# Patient Record
Sex: Male | Born: 1960 | Race: White | Hispanic: No | Marital: Married | Smoking: Never smoker
Health system: Southern US, Community
[De-identification: ages and names within clinical notes are randomized; demographics above are authoritative.]

## PROBLEM LIST (undated history)

## (undated) DIAGNOSIS — T7840XA Allergy, unspecified, initial encounter: Secondary | ICD-10-CM

## (undated) DIAGNOSIS — I10 Essential (primary) hypertension: Secondary | ICD-10-CM

## (undated) DIAGNOSIS — E119 Type 2 diabetes mellitus without complications: Secondary | ICD-10-CM

## (undated) DIAGNOSIS — M1990 Unspecified osteoarthritis, unspecified site: Secondary | ICD-10-CM

## (undated) DIAGNOSIS — F419 Anxiety disorder, unspecified: Secondary | ICD-10-CM

## (undated) DIAGNOSIS — G4730 Sleep apnea, unspecified: Secondary | ICD-10-CM

## (undated) DIAGNOSIS — K219 Gastro-esophageal reflux disease without esophagitis: Secondary | ICD-10-CM

## (undated) DIAGNOSIS — F32A Depression, unspecified: Secondary | ICD-10-CM

## (undated) DIAGNOSIS — E785 Hyperlipidemia, unspecified: Secondary | ICD-10-CM

## (undated) DIAGNOSIS — H269 Unspecified cataract: Secondary | ICD-10-CM

## (undated) HISTORY — DX: Unspecified cataract: H26.9

## (undated) HISTORY — DX: Depression, unspecified: F32.A

## (undated) HISTORY — DX: Allergy, unspecified, initial encounter: T78.40XA

## (undated) HISTORY — DX: Unspecified osteoarthritis, unspecified site: M19.90

## (undated) HISTORY — DX: Anxiety disorder, unspecified: F41.9

## (undated) HISTORY — DX: Sleep apnea, unspecified: G47.30

## (undated) HISTORY — DX: Gastro-esophageal reflux disease without esophagitis: K21.9

## (undated) HISTORY — DX: Type 2 diabetes mellitus without complications: E11.9

## (undated) HISTORY — PX: FRACTURE SURGERY: SHX138

## (undated) HISTORY — PX: WISDOM TOOTH EXTRACTION: SHX21

## (undated) HISTORY — DX: Hyperlipidemia, unspecified: E78.5

## (undated) HISTORY — PX: COLONOSCOPY: SHX174

## (undated) HISTORY — DX: Essential (primary) hypertension: I10

---

## 2014-03-19 LAB — HM DIABETES EYE EXAM

## 2014-03-24 ENCOUNTER — Ambulatory Visit (INDEPENDENT_AMBULATORY_CARE_PROVIDER_SITE_OTHER): Payer: BC Managed Care – PPO | Admitting: Family Medicine

## 2014-03-24 VITALS — BP 124/76 | HR 70 | Temp 97.8°F | Resp 16 | Ht 71.0 in | Wt 232.6 lb

## 2014-03-24 DIAGNOSIS — S76302A Unspecified injury of muscle, fascia and tendon of the posterior muscle group at thigh level, left thigh, initial encounter: Secondary | ICD-10-CM

## 2014-03-24 MED ORDER — PREDNISONE 20 MG PO TABS: 40.0000 mg | ORAL_TABLET | Freq: Every day | ORAL | Status: DC

## 2014-03-24 NOTE — Progress Notes (Signed)
Patient ID: Francisco Griffin, male   DOB: 08/28/1960, 53 y.o.   MRN: [id number]  This chart was scribed for Elvina SidleKurt Jaque Dacy, MD by Charline BillsEssence Howell, ED Scribe. The patient was seen in room 11. Patient's care was started at 12:21 PM.  Patient ID: Francisco Griffin MRN: [id number], DOB: 08/28/1960, 53 y.o. Date of Encounter: 03/24/2014, 12:21 PM  Primary Physician: No primary provider on file.  Chief Complaint  Patient presents with  . Knee Pain    left knee pain--x 5 days--better but still sore--not tender to touch--sore at the back of the knee    HPI: 53 y.o. year old male with history below presents with intermittent L knee pain onset 4 days ago. Pain is worse in the back of the knee. He states that he had transitioned from walking to jogging for approximately 100 ft when he felt a twinge followed by a sharp pain in the back of his knee. Pt was able to walk following the incident. He reports that he currently ambulates with a limp due to pain. Pt denies swelling and discoloration. He has tried ice, heat, stretching and ibuprofen for pain.   Pt works as a Interior and spatial designerrehab engineer; he looks at different sites and tries to figure out how to make the sites accessible for the disabled.   Past Medical History  Diagnosis Date  . Allergy   . Anxiety   . Diabetes mellitus without complication      Home Meds: Prior to Admission medications   Medication Sig Start Date End Date Taking? Authorizing Provider  metFORMIN (GLUCOPHAGE) 850 MG tablet Take 850 mg by mouth 2 (two) times daily with a meal.   Yes Historical Provider, MD  rosuvastatin (CRESTOR) 5 MG tablet Take 2.5 mg by mouth daily.   Yes Historical Provider, MD    Allergies:  Allergies  Allergen Reactions  . Lipitor [Atorvastatin]     History   Social History  . Marital Status: Divorced    Spouse Name: N/A    Number of Children: N/A  . Years of Education: N/A   Occupational History  . Not on file.   Social History Main Topics  . Smoking  status: Never Smoker   . Smokeless tobacco: Not on file  . Alcohol Use: 0.5 oz/week    1 drink(s) per week  . Drug Use: Not on file  . Sexual Activity: Not on file   Other Topics Concern  . Not on file   Social History Narrative  . No narrative on file     Review of Systems: Constitutional: negative for chills, fever, night sweats, weight changes, or fatigue  HEENT: negative for vision changes, hearing loss, congestion, rhinorrhea, ST, epistaxis, or sinus pressure Cardiovascular: negative for chest pain or palpitations Respiratory: negative for hemoptysis, wheezing, shortness of breath, or cough Abdominal: negative for abdominal pain, nausea, vomiting, diarrhea, or constipation Msk: negative for joint swelling, +arthalgias Dermatological: negative for rash Neurologic: negative for headache, dizziness, or syncope All other systems reviewed and are otherwise negative with the exception to those above and in the HPI.   Physical Exam: Triage Vitals: Blood pressure 124/76, pulse 70, temperature 97.8 F (36.6 C), temperature source Oral, resp. rate 16, height 5\' 11"  (1.803 m), weight 232 lb 9.6 oz (105.507 kg), SpO2 98.00%., Body mass index is 32.46 kg/(m^2). General: Well developed, well nourished, in no acute distress. Head: Normocephalic, atraumatic, eyes without discharge, sclera non-icteric, nares are without discharge. Bilateral auditory canals clear, TM's are without perforation, pearly grey  and translucent with reflective cone of light bilaterally. Oral cavity moist, posterior pharynx without exudate, erythema, peritonsillar abscess, or post nasal drip.  Neck: Supple. No thyromegaly. Full ROM. No lymphadenopathy. Lungs: Clear bilaterally to auscultation without wheezes, rales, or rhonchi. Breathing is unlabored. Heart: RRR with S1 S2. No murmurs, rubs, or gallops appreciated. Abdomen: Soft, non-tender, non-distended with normoactive bowel sounds. No hepatomegaly. No  rebound/guarding. No obvious abdominal masses. Msk:  Strength and tone normal for age. Very tight semitendinosus which is tender. Knee ligaments and palpation on inspection are otherwise normal.  Extremities/Skin: Warm and dry. No clubbing or cyanosis. No edema. No rashes or suspicious lesions. Neuro: Alert and oriented X 3. Moves all extremities spontaneously. Gait is normal. CNII-XII grossly in tact. Psych:  Responds to questions appropriately with a normal affect.    ASSESSMENT AND PLAN:  53 y.o. year old male with  1. Hamstring injury, left, initial encounter    I personally performed the services described in this documentation, which was scribed in my presence. The recorded information has been reviewed and is accurate.  Signed, Elvina SidleKurt Whitt Auletta, MD 03/24/2014 12:21 PM

## 2014-03-24 NOTE — Patient Instructions (Addendum)
Hamstring Strain  Hamstrings are the large muscles in the back of the thighs. A strain or tear injury happens when there is a sudden stretch or pull on these muscles and tendons. Tendons are cord like structures that attach muscle to bone. These injuries are commonly seen in activities such as sprinting due to sudden acceleration.  DIAGNOSIS  Often the diagnosis can be made by examination. HOME CARE INSTRUCTIONS   Apply ice to the sore area for 15-7520minutes, 03-04 times per day. Do this while awake for the first 2 days. Put the ice in a plastic bag, and place a towel between the bag of ice and your skin.  Keep your knee flexed when possible. This means your foot is held off the ground slightly if you are on crutches. When lying down, a pillow under the knee will take strain off the muscles and provide some relief.  If a compression bandage such as an ace wrap was applied, use it until you are seen again. You may remove it for sleeping, showers and baths. If the wrap seems to be too tight and is uncomfortable, wrap it more loosely. If your toes or foot are getting cold or blue, it is too tight.  Walk or move around as the pain allows, or as instructed. Resume full activities as suggested by your caregiver. This is often safest when the strength of the injured leg has nearly returned to normal.  Only take over-the-counter or prescription medicines for pain, discomfort, or fever as directed by your caregiver. SEEK MEDICAL CARE IF:   You have an increase in bruising, swelling or pain.  You notice coldness or blueness of your toes or foot.  Pain relief is not obtained with medications.  You have increasing pain in the area and seem to be getting worse rather than better.  You notice your thigh getting larger in size (this could indicate bleeding into the muscle). Document Released: 02/07/2001 Document Revised: 08/07/2011 Document Reviewed: 05/17/2008 [facility] Patient Information 2015  [place], [place]. This information is not intended to replace advice given to you by your health care provider. Make sure you discuss any questions you have with your health care provider.   Crestor type medicine makes muscle injury more likely as well

## 2014-07-30 ENCOUNTER — Ambulatory Visit (INDEPENDENT_AMBULATORY_CARE_PROVIDER_SITE_OTHER): Payer: BC Managed Care – PPO | Admitting: Physician Assistant

## 2014-07-30 VITALS — BP 134/82 | HR 79 | Temp 98.2°F | Resp 16 | Ht 71.5 in | Wt 228.0 lb

## 2014-07-30 DIAGNOSIS — E1169 Type 2 diabetes mellitus with other specified complication: Secondary | ICD-10-CM | POA: Insufficient documentation

## 2014-07-30 DIAGNOSIS — E785 Hyperlipidemia, unspecified: Secondary | ICD-10-CM

## 2014-07-30 DIAGNOSIS — J029 Acute pharyngitis, unspecified: Secondary | ICD-10-CM | POA: Diagnosis not present

## 2014-07-30 LAB — POCT RAPID STREP A (OFFICE): RAPID STREP A SCREEN: NEGATIVE

## 2014-07-30 NOTE — Patient Instructions (Signed)
I think the head and nasal congestion you've been dealing with is most likely due to allergies or recurrent viral uris.  For allergies, daily claritin and daily nasal steroid sprays will help throughout allergy season.  A decongestant like sudafed as needed will help with the congestion. Rest and fluids will help. The sore throat is most likely due to all the congestion and breathing through your mouth more. A humidifier in your room at night may help. Lozenges or sore throat spray may help. Please let us know if you're not better in one week. I'll plan to see you in May or June for management.

## 2014-07-30 NOTE — Progress Notes (Signed)
   Subjective:    Patient ID: Francisco Griffin, male    DOB: 14-Mar-1961, 54 y.o.   MRN: [id number]  Chief Complaint  Patient presents with  . Sore Throat    yesterday  . Otalgia  . Nasal Congestion   Patient Active Problem List   Diagnosis Date Noted  . Diabetes mellitus 07/30/2014   Prior to Admission medications   Medication Sig Start Date End Date Taking? Authorizing Provider  ALPRAZolam (XANAX) 0.25 MG tablet Take 0.25 mg by mouth daily as needed. 06/29/14  Yes Historical Provider, MD  lisinopril (PRINIVIL,ZESTRIL) 2.5 MG tablet  06/29/14  Yes Historical Provider, MD  metFORMIN (GLUCOPHAGE) 850 MG tablet Take 850 mg by mouth 2 (two) times daily with a meal.   Yes Historical Provider, MD  rosuvastatin (CRESTOR) 5 MG tablet Take 2.5 mg by mouth daily.   Yes Historical Provider, MD   Medications, allergies, past medical history, surgical history, family history, social history and problem list reviewed and updated.  HPI  [age] yom with pmh dm2 presents with one day h/o sore throat, otalgia.   Has had nasal congestion for past few wks. Has hx seasonal allergies. Does not take anything for it.   Since yest has had mild st, bilateral otalgia. Mild non prod cough. Denies cp, sob, abd pain, n/v, diarrhea. Denies fever, chills. Pt would like strep swab as has had strep in the past.   Review of Systems See HPI.     Objective:   Physical Exam  Constitutional: He appears well-developed and well-nourished.  Non-toxic appearance. He does not have a sickly appearance. He does not appear ill. No distress.  BP 134/82 mmHg  Pulse 79  Temp(Src) 98.2 F (36.8 C) (Oral)  Resp 16  Ht 5' 11.5" (1.816 m)  Wt 228 lb (103.42 kg)  BMI 31.36 kg/m2  SpO2 98%   HENT:  Right Ear: Tympanic membrane is not erythematous. A middle ear effusion is present.  Left Ear: Tympanic membrane is not erythematous. A middle ear effusion is present.  Nose: Nose normal. Right sinus exhibits no maxillary sinus tenderness  and no frontal sinus tenderness. Left sinus exhibits no maxillary sinus tenderness and no frontal sinus tenderness.  Mouth/Throat: Uvula is midline, oropharynx is clear and moist and mucous membranes are normal.  Neck: No Brudzinski's sign noted.  Pulmonary/Chest: Effort normal and breath sounds normal. He has no decreased breath sounds. He has no wheezes. He has no rhonchi. He has no rales.  Lymphadenopathy:       Head (right side): No submental, no submandibular and no tonsillar adenopathy present.       Head (left side): No submental, no submandibular and no tonsillar adenopathy present.    He has no cervical adenopathy.   Results for orders placed or performed in visit on 07/30/14  POCT rapid strep A  Result Value Ref Range   Rapid Strep A Screen Negative Negative      Assessment & Plan:   [age] yom with pmh dm2 presents with one day h/o sore throat, otalgia.   Sore throat - Plan: POCT rapid strep A, Culture, Group A Strep --strep swab neg --exam, vitals normal --suspect viral uri/allergies --> claritin, nasonex, decongestant as needed --sore throat could be due to night time congestion and mouth breathing --> above congestion tx and humidifier in room, tylenol prn, lozenges, rest   Francisco Lopesodd M. Alahni Varone, PA-C Physician Assistant-Certified [facility]  07/30/2014 4:59 PM

## 2014-08-01 LAB — CULTURE, GROUP A STREP: ORGANISM ID, BACTERIA: NORMAL

## 2014-08-02 ENCOUNTER — Ambulatory Visit (INDEPENDENT_AMBULATORY_CARE_PROVIDER_SITE_OTHER): Payer: BC Managed Care – PPO | Admitting: Emergency Medicine

## 2014-08-02 VITALS — BP 118/70 | HR 70 | Temp 98.0°F | Resp 18 | Ht 71.0 in | Wt 227.2 lb

## 2014-08-02 DIAGNOSIS — J209 Acute bronchitis, unspecified: Secondary | ICD-10-CM

## 2014-08-02 DIAGNOSIS — J0140 Acute pansinusitis, unspecified: Secondary | ICD-10-CM

## 2014-08-02 MED ORDER — AMOXICILLIN-POT CLAVULANATE 875-125 MG PO TABS: 1.0000 | ORAL_TABLET | Freq: Two times a day (BID) | ORAL | Status: DC

## 2014-08-02 MED ORDER — HYDROCOD POLST-CHLORPHEN POLST 10-8 MG/5ML PO LQCR: 5.0000 mL | Freq: Two times a day (BID) | ORAL | Status: DC | PRN

## 2014-08-02 MED ORDER — PSEUDOEPHEDRINE-GUAIFENESIN ER 60-600 MG PO TB12: 1.0000 | ORAL_TABLET | Freq: Two times a day (BID) | ORAL | Status: DC

## 2014-08-02 NOTE — Progress Notes (Signed)
[facility] [address], [place] [address] [phone]  Date:  08/02/2014   Name:  Francisco BowelsJames Shelden   DOB:  [birth year]   MRN:  [id number]  PCP:  No primary care provider on file.    Chief Complaint: Nasal Congestion; Sore Throat; and Coughing   History of Present Illness:  Francisco BowelsJames Pfeffer is a 54 y.o. very pleasant male patient who presents with the following:  Will for several weeks with nasal congestion and post nasal drip.  Mostly mucoid. Seen three days ago with sore throat and increased congestion Has purulent nasal drainage now and today noticed a left conjunctival injection with purulent drainage and gluing Has cough productive of purulent drainage. No wheezing or shortness of breath. No fever or chills. No nausea or vomiting No stool change Malaise and fatigue No improvement with over the counter medications or other home remedies.  Denies other complaint or health concern today.   Patient Active Problem List   Diagnosis Date Noted  . Diabetes mellitus 07/30/2014    Past Medical History  Diagnosis Date  . Allergy   . Anxiety   . Diabetes mellitus without complication     Past Surgical History  Procedure Laterality Date  . Fracture surgery      5th grade broke wrist    History  Substance Use Topics  . Smoking status: Never Smoker   . Smokeless tobacco: Not on file  . Alcohol Use: 0.5 oz/week    1 drink(s) per week    Family History  Problem Relation Age of Onset  . Cancer Mother   . Heart disease Father   . Hyperlipidemia Sister   . Cancer Maternal Grandfather   . Heart disease Paternal Grandmother   . Diabetes Paternal Grandfather   . Heart disease Paternal Grandfather     Allergies  Allergen Reactions  . Lipitor [Atorvastatin]     Medication list has been reviewed and updated.  Current Outpatient Prescriptions on File Prior to Visit  Medication Sig Dispense Refill  . ALPRAZolam (XANAX) 0.25 MG tablet Take 0.25 mg  by mouth daily as needed.  0  . lisinopril (PRINIVIL,ZESTRIL) 2.5 MG tablet   5  . metFORMIN (GLUCOPHAGE) 850 MG tablet Take 850 mg by mouth 2 (two) times daily with a meal.    . rosuvastatin (CRESTOR) 5 MG tablet Take 2.5 mg by mouth daily.     No current facility-administered medications on file prior to visit.    Review of Systems:  As per HPI, otherwise negative.    Physical Examination: Filed Vitals:   08/02/14 0952  BP: 118/70  Pulse: 70  Temp: 98 F (36.7 C)  Resp: 18   Filed Vitals:   08/02/14 0952  Height: 5\' 11"  (1.803 m)  Weight: 227 lb 3.2 oz (103.057 kg)   Body mass index is 31.7 kg/(m^2). Ideal Body Weight: Weight in (lb) to have BMI = 25: 178.9  GEN: WDWN, moderate distress, Non-toxic, A & O x 3 HEENT: Atraumatic, Normocephalic. Neck supple. No masses, No LAD. Ears and Nose: No external deformity. CV: RRR, No M/G/R. No JVD. No thrill. No extra heart sounds. PULM: CTA B, no wheezes, crackles, rhonchi. No retractions. No resp. distress. No accessory muscle use. ABD: S, NT, ND, +BS. No rebound. No HSM. EXTR: No c/c/e NEURO Normal gait.  PSYCH: Normally interactive. Conversant. Not depressed or anxious appearing.  Calm demeanor.    Assessment and Plan: Sinusitis Bronchitis augmentin mucinex d tussionex  Signed,  Ellison Carwin, MD

## 2014-08-02 NOTE — Patient Instructions (Signed)

## 2014-08-25 ENCOUNTER — Ambulatory Visit (INDEPENDENT_AMBULATORY_CARE_PROVIDER_SITE_OTHER): Payer: BC Managed Care – PPO | Admitting: Emergency Medicine

## 2014-08-25 ENCOUNTER — Other Ambulatory Visit: Payer: Self-pay | Admitting: Emergency Medicine

## 2014-08-25 VITALS — BP 108/82 | HR 79 | Temp 98.1°F | Resp 16 | Ht 71.0 in | Wt 221.5 lb

## 2014-08-25 DIAGNOSIS — A084 Viral intestinal infection, unspecified: Secondary | ICD-10-CM

## 2014-08-25 LAB — C. DIFFICILE GDH AND TOXIN A/B
C. DIFF TOXIN A/B: NOT DETECTED
C. DIFFICILE GDH: NOT DETECTED

## 2014-08-25 MED ORDER — ONDANSETRON 8 MG PO TBDP: 8.0000 mg | ORAL_TABLET | Freq: Three times a day (TID) | ORAL | Status: DC | PRN

## 2014-08-25 MED ORDER — LOPERAMIDE HCL 2 MG PO TABS: 2.0000 mg | ORAL_TABLET | Freq: Four times a day (QID) | ORAL | Status: DC | PRN

## 2014-08-25 MED ORDER — LOPERAMIDE HCL 2 MG PO TABS: ORAL_TABLET | ORAL | Status: DC

## 2014-08-25 NOTE — Progress Notes (Signed)
[facility] [address], [place] [address] [phone]  Date:  08/25/2014   Name:  Julius BowelsJames Faulk   DOB:  09-14-1960   MRN:  [id number]  PCP:  No primary care provider on file.    Chief Complaint: Nausea   History of Present Illness:  Julius BowelsJames Brightwell is a 54 y.o. very pleasant male patient who presents with the following:  Ill since Saturday with nausea and vomiting and diarrhea.   The patient has no complaint of blood, mucous, or pus in her stools. Chilled but no fever No rash No ill contacts No cough or coryza No improvement with over the counter medications or other home remedies.  Denies other complaint or health concern today.   Patient Active Problem List   Diagnosis Date Noted  . Diabetes mellitus 07/30/2014    Past Medical History  Diagnosis Date  . Allergy   . Anxiety   . Diabetes mellitus without complication     Past Surgical History  Procedure Laterality Date  . Fracture surgery      5th grade broke wrist    History  Substance Use Topics  . Smoking status: Never Smoker   . Smokeless tobacco: Not on file  . Alcohol Use: 0.6 oz/week    1 Standard drinks or equivalent per week    Family History  Problem Relation Age of Onset  . Cancer Mother   . Heart disease Father   . Pulmonary fibrosis Father   . Hyperlipidemia Sister   . Cancer Maternal Grandfather   . Heart disease Paternal Grandmother   . Diabetes Paternal Grandfather   . Heart disease Paternal Grandfather     Allergies  Allergen Reactions  . Lipitor [Atorvastatin]     Muscle aches across his chest    Medication list has been reviewed and updated.  Current Outpatient Prescriptions on File Prior to Visit  Medication Sig Dispense Refill  . ALPRAZolam (XANAX) 0.25 MG tablet Take 0.25 mg by mouth daily as needed.  0  . lisinopril (PRINIVIL,ZESTRIL) 2.5 MG tablet Take 2.5 mg by mouth daily.   5  . metFORMIN (GLUCOPHAGE) 850 MG tablet Take 850 mg by mouth 2  (two) times daily with a meal.    . rosuvastatin (CRESTOR) 5 MG tablet Take 2.5 mg by mouth daily.     No current facility-administered medications on file prior to visit.    Review of Systems:  As per HPI, otherwise negative.    Physical Examination: Filed Vitals:   08/25/14 0930  BP: 108/82  Pulse: 79  Temp: 98.1 F (36.7 C)  Resp: 16   Filed Vitals:   08/25/14 0930  Height: 5\' 11"  (1.803 m)  Weight: 221 lb 8 oz (100.472 kg)   Body mass index is 30.91 kg/(m^2). Ideal Body Weight: Weight in (lb) to have BMI = 25: 178.9  GEN: WDWN, moderate distress.  Well hydrated, Non-toxic, A & O x 3 HEENT: Atraumatic, Normocephalic. Neck supple. No masses, No LAD. Ears and Nose: No external deformity. CV: RRR, No M/G/R. No JVD. No thrill. No extra heart sounds. PULM: CTA B, no wheezes, crackles, rhonchi. No retractions. No resp. distress. No accessory muscle use. ABD: S, NT, ND, +BS. No rebound. No HSM. EXTR: No c/c/e NEURO Normal gait.  PSYCH: Normally interactive. Conversant. Not depressed or anxious appearing.  Calm demeanor.    Assessment and Plan: Gastroenteritis Imodium zofran Clears  Signed,  Phillips OdorJeffery Anastacio Bua, MD

## 2014-08-25 NOTE — Patient Instructions (Signed)
Viral Gastroenteritis Viral gastroenteritis is also known as stomach flu. This condition affects the stomach and intestinal tract. It can cause sudden diarrhea and vomiting. The illness typically lasts 3 to 8 days. Most people develop an immune response that eventually gets rid of the virus. While this natural response develops, the virus can make you quite ill. CAUSES  Many different viruses can cause gastroenteritis, such as rotavirus or noroviruses. You can catch one of these viruses by consuming contaminated food or water. You may also catch a virus by sharing utensils or other personal items with an infected person or by touching a contaminated surface. SYMPTOMS  The most common symptoms are diarrhea and vomiting. These problems can cause a severe loss of body fluids (dehydration) and a body salt (electrolyte) imbalance. Other symptoms may include:  Fever.  Headache.  Fatigue.  Abdominal pain. DIAGNOSIS  Your caregiver can usually diagnose viral gastroenteritis based on your symptoms and a physical exam. A stool sample may also be taken to test for the presence of viruses or other infections. TREATMENT  This illness typically goes away on its own. Treatments are aimed at rehydration. The most serious cases of viral gastroenteritis involve vomiting so severely that you are not able to keep fluids down. In these cases, fluids must be given through an intravenous line (IV). HOME CARE INSTRUCTIONS   Drink enough fluids to keep your urine clear or pale yellow. Drink small amounts of fluids frequently and increase the amounts as tolerated.  Ask your caregiver for specific rehydration instructions.  Avoid:  Foods high in sugar.  Alcohol.  Carbonated drinks.  Tobacco.  Juice.  Caffeine drinks.  Extremely hot or cold fluids.  Fatty, greasy foods.  Too much intake of anything at one time.  Dairy products until 24 to 48 hours after diarrhea stops.  You may consume probiotics.  Probiotics are active cultures of beneficial bacteria. They may lessen the amount and number of diarrheal stools in adults. Probiotics can be found in yogurt with active cultures and in supplements.  Wash your hands well to avoid spreading the virus.  Only take over-the-counter or prescription medicines for pain, discomfort, or fever as directed by your caregiver. Do not give aspirin to children. Antidiarrheal medicines are not recommended.  Ask your caregiver if you should continue to take your regular prescribed and over-the-counter medicines.  Keep all follow-up appointments as directed by your caregiver. SEEK IMMEDIATE MEDICAL CARE IF:   You are unable to keep fluids down.  You do not urinate at least once every 6 to 8 hours.  You develop shortness of breath.  You notice blood in your stool or vomit. This may look like coffee grounds.  You have abdominal pain that increases or is concentrated in one small area (localized).  You have persistent vomiting or diarrhea.  You have a fever.  The patient is a child younger than 3 months, and he or she has a fever.  The patient is a child older than 3 months, and he or she has a fever and persistent symptoms.  The patient is a child older than 3 months, and he or she has a fever and symptoms suddenly get worse.  The patient is a baby, and he or she has no tears when crying. MAKE SURE YOU:   Understand these instructions.  Will watch your condition.  Will get help right away if you are not doing well or get worse. Document Released: 05/15/2005 Document Revised: 08/07/2011 Document Reviewed: 03/01/2011   ExitCare Patient Information 2015 [employer]. This information is not intended to replace advice given to you by your health care provider. Make sure you discuss any questions you have with your health care provider. Clear Liquid Diet A clear liquid diet is a short-term diet that is prescribed to provide the necessary fluid and  basic energy you need when you can have nothing else. The clear liquid diet consists of liquids or solids that will become liquid at room temperature. You should be able to see through the liquid. There are many reasons that you may be restricted to clear liquids, such as:  When you have a sudden-onset (acute) condition that occurs before or after surgery.  To help your body slowly get adjusted to food again after a long period when you were unable to have food.  Replacement of fluids when you have a diarrheal disease.  When you are going to have certain exams, such as a colonoscopy, in which instruments are inserted inside your body to look at parts of your digestive system. WHAT CAN I HAVE? A clear liquid diet does not provide all the nutrients you need. It is important to choose a variety of the following items to get as many nutrients as possible:  Vegetable juices that do not have pulp.  Fruit juices and fruit drinks that do not have pulp.  Coffee (regular or decaffeinated), tea, or soda at the discretion of your health care provider.  Clear bouillon, broth, or strained broth-based soups.  High-protein and flavored gelatins.  Sugar or honey.  Ices or frozen ice pops that do not contain milk. If you are not sure whether you can have certain items, you should ask your health care provider. You may also ask your health care provider if there are any other clear liquid options. Document Released: 05/15/2005 Document Revised: 05/20/2013 Document Reviewed: 04/11/2013 ExitCare Patient Information 2015 [employer], [employer]. This information is not intended to replace advice given to you by your health care provider. Make sure you discuss any questions you have with your health care provider.  

## 2014-08-25 NOTE — Addendum Note (Signed)
Addended by: Carmelina DaneANDERSON, Jyron Turman S on: 08/25/2014 10:34 AM   Modules accepted: Orders

## 2015-05-10 LAB — HM DIABETES EYE EXAM

## 2015-10-22 ENCOUNTER — Ambulatory Visit (INDEPENDENT_AMBULATORY_CARE_PROVIDER_SITE_OTHER): Payer: BC Managed Care – PPO | Admitting: Emergency Medicine

## 2015-10-22 VITALS — BP 126/72 | HR 72 | Temp 97.7°F | Resp 18 | Ht 71.0 in | Wt 249.0 lb

## 2015-10-22 DIAGNOSIS — S90562A Insect bite (nonvenomous), left ankle, initial encounter: Secondary | ICD-10-CM

## 2015-10-22 DIAGNOSIS — T63303A Toxic effect of unspecified spider venom, assault, initial encounter: Secondary | ICD-10-CM | POA: Diagnosis not present

## 2015-10-22 MED ORDER — MUPIROCIN 2 % EX OINT: TOPICAL_OINTMENT | CUTANEOUS | Status: DC

## 2015-10-22 MED ORDER — CEPHALEXIN 500 MG PO CAPS: 500.0000 mg | ORAL_CAPSULE | Freq: Three times a day (TID) | ORAL | Status: DC

## 2015-10-22 NOTE — Patient Instructions (Addendum)
IF you received an x-ray today, you will receive an invoice from [facility]. Please contact [facility] at [phone] with questions or concerns regarding your invoice.   IF you received labwork today, you will receive an invoice from [facility]. Please contact [facility] at [phone] with questions or concerns regarding your invoice.   Our billing staff will not be able to assist you with questions regarding bills from these companies.  You will be contacted with the lab results as soon as they are available. The fastest way to get your results is to activate your My Chart account. Instructions are located on the last page of this paperwork. If you have not heard from us regarding the results in 2 weeks, please contact this office.     Manson PasseyBrown Recluse Spider Bite Brown recluse spiders can inject poison (venom) into the wound when they bite a person. In most cases, a bite from a brown recluse spider causes mild redness and swelling around the bite. However, in rare cases, bites can be serious and even life threatening. Brown recluse spiders can be dark brown to light tan in color. On their back, they have a band of darker color that is shaped like a violin. They are found mainly in the [place] the [place] far [place] as [place]They may be found outdoors underneath items lying on the ground. They may also live indoors in places that are out of the way, such as attics. CAUSES  A spider bite is often caused by a person accidentally making contact with a spider in a way that traps the spider against the person's skin. SYMPTOMS  Symptoms of this condition may include:  Pain and redness at the site of the bite. This may begin as a small, painful blister with redness around it. The blister may break open and create a sore (ulcer) that can get worse and spread over time. This may result in an area of tissue death up to 12 inches (30 cm)  wide.  A general feeling of sickness (malaise).  Nausea or vomiting.  Fever.  Body aches. Symptoms may get worse during several days after you are bitten. DIAGNOSIS  This condition may be diagnosed based on your symptoms and a physical exam. Your health care provider will ask about the history of your injury and any details you may have about the spider.  TREATMENT  There is no single cure (antidote) to treat this bite. Treatment will usually focus on caring for the wound. Options may include:   Covering the wound with a bandage (dressing).  Medicines to treat the ulcer and help prevent tissue death.  Antibiotic medicine.  Tetanus shot.  Surgery to remove damaged tissue. This may be needed if a large ulcer develops in the bite area. HOME CARE INSTRUCTIONS  Medicines  Take or apply over-the-counter and prescription medicines only as told by your health care provider.  If you were prescribed an antibiotic medicine, take or apply it as told by your health care provider. Do not stop using the antibiotic even if your condition improves. Wound Care  Follow instructions from your health care provider about how to take care of your wound. Make sure you:  Wash your hands with soap and water before you change your dressing. If soap and water are not available, use hand sanitizer.  Change your dressing as told by your health care provider.  Keep the bite area clean and dry. Wash the bite  area daily with soap and water as told by your health care provider.  Do not scratch the bite area. General Instructions  If directed, apply ice to the bite area.  Put ice in a plastic bag.  Place a towel between your skin and the bag.  Leave the ice on for 20 minutes, 2-3 times per day.  Raise (elevate) the bite area above the level of your heart while you are sitting or lying down, if this is possible.  Keep all follow-up visits as told by your health care provider. This is  important. SEEK MEDICAL CARE IF:   Your symptoms get worse or do not improve after 24 hours.  You have increasing redness, swelling, or pain in the bite area. SEEK IMMEDIATE MEDICAL CARE IF:   Your ulcer appears to be getting larger or growing deeper.  You have fluid, blood, or pus coming from the bite area.  You have chills or a fever.  You feel nauseous or you vomit.  You have muscle aches.  You feel unusually weak or tired.  You have involuntary muscle movements (convulsions).  You develop a rash.  You urinate less frequently than usual.  You have blood in your urine or other unusual bleeding.  Your skin turns yellow.   This information is not intended to replace advice given to you by your health care provider. Make sure you discuss any questions you have with your health care provider.   Document Released: 05/15/2005 Document Revised: 02/03/2015 Document Reviewed: 09/30/2014 Elsevier Interactive Patient Education [employer]

## 2015-10-22 NOTE — Progress Notes (Signed)
By signing my name below, I, Raven Small, attest that this documentation has been prepared under the direction and in the presence of Lesle Chris, MD.  Electronically Signed: Andrew Au, ED Scribe. 10/22/2015. 11:46 AM.  Chief Complaint:  Chief Complaint  Patient presents with   Insect Bite    spider bite on left ankle. 1st noticed on Monday.     HPI: Francisco Griffin is a 55 y.o. male who reports to [facility] today complaining of an insect bite noticed 4 days ago. Pt believes he was bit by a spider. He did not see a spider. He states area has been red and itchy but denies pain or drainage. He has been washing area, cleaning it with peroxide and alcohol, applying neosporin and cover area with Band-Aid.  Pt was not working outside at the time. Pt denies fever and chills. Pt is allergies to Lipitor but denies abx allergies.  Past Medical History  Diagnosis Date   Allergy    Anxiety    Diabetes mellitus without complication (HCC)    Past Surgical History  Procedure Laterality Date   Fracture surgery      5th grade broke wrist   Social History   Social History   Marital Status: Divorced    Spouse Name: N/A   Number of Children: N/A   Years of Education: N/A   Social History Main Topics   Smoking status: Never Smoker    Smokeless tobacco: None   Alcohol Use: 0.6 oz/week    1 Standard drinks or equivalent per week   Drug Use: None   Sexual Activity: Not Asked   Other Topics Concern   None   Social History Narrative   Family History  Problem Relation Age of Onset   Cancer Mother    Heart disease Father    Pulmonary fibrosis Father    Hyperlipidemia Sister    Cancer Maternal Grandfather    Heart disease Paternal Grandmother    Diabetes Paternal Grandfather    Heart disease Paternal Grandfather    Allergies  Allergen Reactions   Lipitor [Atorvastatin]     Muscle aches across his chest   Prior to Admission medications   Medication Sig Start Date  End Date Taking? Authorizing Provider  lisinopril (PRINIVIL,ZESTRIL) 2.5 MG tablet Take 2.5 mg by mouth daily.  06/29/14  Yes Historical Provider, MD  metFORMIN (GLUCOPHAGE) 850 MG tablet Take 850 mg by mouth 2 (two) times daily with a meal.   Yes Historical Provider, MD  omeprazole (PRILOSEC OTC) 20 MG tablet Take 20 mg by mouth daily.   Yes Historical Provider, MD  rosuvastatin (CRESTOR) 5 MG tablet Take 2.5 mg by mouth daily.   Yes Historical Provider, MD  ALPRAZolam (XANAX) 0.25 MG tablet Take 0.25 mg by mouth daily as needed. Reported on 10/22/2015 06/29/14   Historical Provider, MD  loperamide (IMODIUM A-D) 2 MG tablet 2 now and one hourly prn diarrhea.  Max 8 tabs in 24 hours Patient not taking: Reported on 10/22/2015 08/25/14   Carmelina Dane, MD  ondansetron (ZOFRAN-ODT) 8 MG disintegrating tablet Take 1 tablet (8 mg total) by mouth every 8 (eight) hours as needed for nausea. Patient not taking: Reported on 10/22/2015 08/25/14   Carmelina Dane, MD     ROS: The patient denies fevers, chills, night sweats, unintentional weight loss, chest pain, palpitations, wheezing, dyspnea on exertion, nausea, vomiting, abdominal pain, dysuria, hematuria, melena, numbness, weakness, or tingling.  All other systems have been reviewed  and were otherwise negative with the exception of those mentioned in the HPI and as above.    PHYSICAL EXAM: Filed Vitals:   10/22/15 1035  BP: 126/72  Pulse: 72  Temp: 97.7 F (36.5 C)  Resp: 18   Body mass index is 34.74 kg/(m^2).   General: Alert, no acute distress HEENT:  Normocephalic, atraumatic, oropharynx patent. Eye: Nonie HoyerOMI[facility] Cardiovascular:  Regular rate and rhythm, no rubs murmurs or gallops.  No Carotid bruits, radial pulse intact. No pedal edema.  Respiratory: Clear to auscultation bilaterally.  No wheezes, rales, or rhonchi.  No cyanosis, no use of accessory musculature Abdominal: No organomegaly, abdomen is soft and non-tender, positive bowel  sounds.  No masses. Musculoskeletal: Gait intact. No edema, tenderness Skin:  8 x 7 mm shallow ulcer 2 cm above medial malleolus. 28 x 18 mm of irregular redness around ulcer.  Neurologic: Facial musculature symmetric. Psychiatric: Patient acts appropriately throughout our interaction. Lymphatic: No cervical or submandibular lymphadenopathy  ASSESSMENT/PLAN: This definitely does appear to be a brown recluse bite. It also could be a venous stasis ulcer but he does not really have significant venous disease. He will treat this with Bactroban. He was given a prescription for cephalexin to take if he had worsening while on history it. I did do a culture of the area.I personally performed the services described in this documentation, which was scribed in my presence. The recorded information has been reviewed and is accurate.   Gross sideeffects, risk and benefits, and alternatives of medications d/w patient. Patient is aware that all medications have potential sideeffects and we are unable to predict every sideeffect or drug-drug interaction that may occur.  Lesle ChrisSteven Daub MD 10/22/2015 11:46 AM

## 2015-10-25 LAB — WOUND CULTURE
GRAM STAIN: NONE SEEN
Gram Stain: NONE SEEN
Gram Stain: NONE SEEN
Organism ID, Bacteria: NO GROWTH

## 2016-01-20 ENCOUNTER — Encounter: Payer: BC Managed Care – PPO | Admitting: Family Medicine

## 2016-03-02 ENCOUNTER — Encounter: Payer: BC Managed Care – PPO | Admitting: Family Medicine

## 2016-08-10 ENCOUNTER — Encounter: Payer: Self-pay | Admitting: Sports Medicine

## 2016-08-10 ENCOUNTER — Ambulatory Visit (INDEPENDENT_AMBULATORY_CARE_PROVIDER_SITE_OTHER): Payer: BC Managed Care – PPO | Admitting: Sports Medicine

## 2016-08-10 ENCOUNTER — Encounter (INDEPENDENT_AMBULATORY_CARE_PROVIDER_SITE_OTHER): Payer: Self-pay

## 2016-08-10 ENCOUNTER — Ambulatory Visit
Admission: RE | Admit: 2016-08-10 | Discharge: 2016-08-10 | Disposition: A | Discharge status: Home / Self Care | Payer: BC Managed Care – PPO | Source: Ambulatory Visit | Attending: Sports Medicine | Admitting: Sports Medicine

## 2016-08-10 VITALS — BP 149/85 | HR 67 | Ht 72.0 in | Wt 240.0 lb

## 2016-08-10 DIAGNOSIS — M23203 Derangement of unspecified medial meniscus due to old tear or injury, right knee: Secondary | ICD-10-CM

## 2016-08-10 DIAGNOSIS — M1711 Unilateral primary osteoarthritis, right knee: Secondary | ICD-10-CM | POA: Insufficient documentation

## 2016-08-10 DIAGNOSIS — M25561 Pain in right knee: Secondary | ICD-10-CM

## 2016-08-10 MED ORDER — METHYLPREDNISOLONE ACETATE 40 MG/ML IJ SUSP
40.0000 mg | Freq: Once | INTRAMUSCULAR | Status: AC
  Administered 2016-08-10: 40 mg via INTRA_ARTICULAR

## 2016-08-10 NOTE — Assessment & Plan Note (Addendum)
Arthritis seen on ultrasound, as well as swelling in the suprapatellar pouch. Injection performed today. Patient tolerated well. He'll follow-up in a month to see if his knee pain is still present or if it has resolved.

## 2016-08-10 NOTE — Assessment & Plan Note (Signed)
Possibly the culprit of his right knee pain. We'll try an injection to see if this helps.  If it is a meniscus tear, his pain will recur after the injection. Would consider MRI at that time. X-rays are to check for the degree of arthritis in his knees to better conform him about surgical options.

## 2016-08-10 NOTE — Progress Notes (Signed)
  Francisco Griffin - 56 y.o. male MRN [id number]  Date of birth: December 27, 1960  SUBJECTIVE:  Including CC & ROS.  CC: right knee pain  Presents with right knee pain that began on Sunday. He states he was walking and then all of a sudden he moved his knee funny and started having right knee. He notices a little bit of swelling on the medial aspect. He is not sure if he twisted it. Denies hyperextending. Did not fall after this knee pain. He is still having knee pain at this time. He is favoring his right leg and finding it difficult to ambulate. Denies any numbness or tingling distally. Denies any instability of his knee     ROS: No unexpected weight loss, fever, chills, instability, muscle pain, numbness/tingling, redness, otherwise see HPI   PMHx - Updated and reviewed.  Contributory factors include: Does have diabetes mellitus, controlled PSHx - Updated and reviewed.  Contributory factors include:  Negative FHx - Updated and reviewed.  Contributory factors include:  Negative Social Hx - Updated and reviewed. Contributory factors include: Negative Medications - reviewed   DATA REVIEWED: none  PHYSICAL EXAM:  VS: BP:(!) 149/85  HR:67bpm  TEMP: ( )  RESP:   HT:6' (182.9 cm)   WT:240 lb (108.9 kg)  BMI:32.6 PHYSICAL EXAM: Gen: NAD, alert, cooperative with exam, well-appearing HEENT: clear conjunctiva,  CV:  no edema, capillary refill brisk, normal rate Resp: non-labored Skin: no rashes, normal turgor  Neuro: no gross deficits.  Psych:  alert and oriented  Knee: Normal to inspection with no erythema or obvious bony abnormalities, trace effusion Palpation normal with no warmth, condyle tenderness. positive medial joint line tenderness, no lateral joint line tenderness on the right  ROM full in flexion and extension and lower leg rotation. Ligaments with solid consistent endpoints including ACL, PCL, LCL, MCL. Negative Mcmurray's, Apley's, and Thessalonian tests. Non painful patellar  compression. Patellar glide with crepitus bilaterally. Patellar and quadriceps tendons unremarkable. Hamstring and quadriceps strength is normal.     ASSESSMENT & PLAN:   Degenerative tear of medial meniscus of right knee Possibly the culprit of his right knee pain. We'll try an injection to see if this helps.  If it is a meniscus tear, his pain will recur after the injection. Would consider MRI at that time. X-rays are to check for the degree of arthritis in his knees to better conform him about surgical options.  Osteoarthritis of right knee Arthritis seen on ultrasound, as well as swelling in the suprapatellar pouch. Injection performed today. Patient tolerated well. He'll follow-up in a month to see if his knee pain is still present or if it has resolved.   Procedure:  Injection of right knee Consent obtained and verified. Time-out conducted. Noted no overlying erythema, induration, or other signs of local infection. Skin prepped in a sterile fashion. Topical analgesic spray: Ethyl chloride. Completed without difficulty. Meds: 40 mg depomedrol, 3cc 1% lidocaine Pain immediately improved suggesting accurate placement of the medication. Advised to call if fevers/chills, erythema, induration, drainage, or persistent bleeding.

## 2016-08-11 ENCOUNTER — Other Ambulatory Visit: Payer: Self-pay | Admitting: *Deleted

## 2016-08-11 ENCOUNTER — Telehealth: Payer: Self-pay | Admitting: Student

## 2016-08-11 DIAGNOSIS — M23203 Derangement of unspecified medial meniscus due to old tear or injury, right knee: Secondary | ICD-10-CM

## 2016-08-11 NOTE — Telephone Encounter (Signed)
Called pt and left message to call the office back.  Would like to go over x-rays and get an MRI.    Signed,  Corliss MarcusAlicia Barnes, DO [facility] 3:23 PM

## 2016-08-11 NOTE — Telephone Encounter (Signed)
Pt called back, verified name and DOB.  Reviewed right knee x-ray- possible OCD lesion.  Recommend right knee MRI to further investigate.  Will call back with results.  He expressed understanding and had no further questions.   Signed,  Corliss MarcusAlicia Barnes, DO [facility] 3:37 PM

## 2016-08-15 ENCOUNTER — Other Ambulatory Visit: Payer: BC Managed Care – PPO

## 2016-09-05 ENCOUNTER — Telehealth: Payer: Self-pay | Admitting: Sports Medicine

## 2016-09-05 ENCOUNTER — Encounter: Payer: Self-pay | Admitting: Sports Medicine

## 2016-09-05 NOTE — Telephone Encounter (Signed)
I spoke with Francisco Griffin on the phone today after reviewing the MRI of Francisco Griffin right knee. There is no obvious OCD. He does have moderate osteoarthritis of the medial and lateral compartments. He also has a tear of the medial meniscus which I think is degenerative. He is doing well after Francisco Griffin recent cortisone injection. He has been less than compliant with Francisco Griffin home exercises. He agrees that he will become more compliant with these and he will also return to the gym but will avoid high impact activities such as running and jumping. He is asking specifically about stair exercises and elliptical and I think both of these are okay as long as Francisco Griffin knee pain is tolerable. Given the degenerative changes seen in Francisco Griffin knee I think we need to exhaust all conservative treatment prior to considering arthroscopy. Patient is in agreement with this plan. Since he is doing well we will cancel Francisco Griffin follow-up appointment later this week and he will follow-up with me as needed.

## 2016-09-07 ENCOUNTER — Ambulatory Visit: Payer: BC Managed Care – PPO | Admitting: Sports Medicine

## 2016-11-24 ENCOUNTER — Ambulatory Visit (INDEPENDENT_AMBULATORY_CARE_PROVIDER_SITE_OTHER): Payer: BC Managed Care – PPO | Admitting: Family Medicine

## 2016-11-24 ENCOUNTER — Encounter: Payer: Self-pay | Admitting: Family Medicine

## 2016-11-24 VITALS — BP 140/68 | HR 84 | Temp 98.3°F | Wt 246.4 lb

## 2016-11-24 DIAGNOSIS — K219 Gastro-esophageal reflux disease without esophagitis: Secondary | ICD-10-CM | POA: Diagnosis not present

## 2016-11-24 DIAGNOSIS — G4733 Obstructive sleep apnea (adult) (pediatric): Secondary | ICD-10-CM | POA: Insufficient documentation

## 2016-11-24 DIAGNOSIS — E119 Type 2 diabetes mellitus without complications: Secondary | ICD-10-CM

## 2016-11-24 DIAGNOSIS — Z1159 Encounter for screening for other viral diseases: Secondary | ICD-10-CM | POA: Diagnosis not present

## 2016-11-24 DIAGNOSIS — Z0000 Encounter for general adult medical examination without abnormal findings: Secondary | ICD-10-CM

## 2016-11-24 DIAGNOSIS — E669 Obesity, unspecified: Secondary | ICD-10-CM | POA: Insufficient documentation

## 2016-11-24 DIAGNOSIS — B009 Herpesviral infection, unspecified: Secondary | ICD-10-CM | POA: Insufficient documentation

## 2016-11-24 NOTE — Assessment & Plan Note (Addendum)
Chronic. Controlled on metformin. On ACE-I, ASA, and statin therapy. --Continue metformin 850 mg BID, ASA 81 mg daily, Crestor 2.5 mg daily --Referral to optho placed for annual DM eye exam

## 2016-11-24 NOTE — Assessment & Plan Note (Addendum)
New pt, initial encounter. Last colonoscopy 1 polyp benign, overdue. Needs optho f/u. DM controlled. On statin and ASA. Last CMP and lipids reviewed for 06/2016. UTD on HIV. Uncertain HCV screen. --Continue Crestor 2.5 mg daily, ASA 81 mg daily --Given form to schedule screening colonoscopy --Diabetic foot exam performed and updated --Release of info sent to obtain vaccines, may need Tdap and PNA vaccines next visit

## 2016-11-24 NOTE — Assessment & Plan Note (Addendum)
Chronic. Controlled. On Prilosec. --Continue Prilosec 20 mg daily

## 2016-11-24 NOTE — Patient Instructions (Signed)
Thank you for coming in to see us today. Please see below to review our plan for today's visit.  1. We will contact her previous office for a complete records. In the meantime we will wait to administer vaccinations and additional blood work. 2. Please call the GI specialists to schedule a screening colonoscopy. I have sent a referral for a ophthalmology diabetic exam referral. 3. Return to the clinic in one month.  Please call the clinic at [phone] if your symptoms worsen or you have any concerns. It was my pleasure to see you. -- Durward Parcelavid McMullen, DO [facility], PGY-1

## 2016-11-24 NOTE — Progress Notes (Signed)
Subjective:   Patient ID: Francisco Griffin    DOB: 06-09-1960, 56 y.o. male   MRN: [id number]  CC: "establish care"  HPI: Francisco BowelsJames Hark is a 56 y.o. male who presents to clinic today to establish care. Problems discussed today are as follows:  Diabetes: Controlled on metformin. Last A1c 06/2016 at 6.4. Denies micro or macrovascular involvement. Was previously seeing optho for annual exams. ROS: Denies nausea or vomiting, polyuria, polyphagia, polydipsia, abdominal pain, diminished sensation, foot burring or ulceration.  GERD: On PPI without difficulty. Taking additional herbal supplements.  ROS: Denies weight loss, reflux, hoarseness, nausea or vomiting, dysphagia.  Preventative care: [facility]. Been receiving health maintenance.   Complete ROS performed, see HPI for pertinent.  PMFSH: NIDDM, OA. Smoking status reviewed. Medications reviewed.  Objective:   BP 140/68   Pulse 84   Temp 98.3 F (36.8 C) (Oral)   Wt 246 lb 6.4 oz (111.8 kg)   SpO2 96%   BMI 33.42 kg/m  Vitals and nursing note reviewed.  General: obese, well nourished, well developed, in no acute distress with non-toxic appearance HEENT: normocephalic, atraumatic, moist mucous membranes Neck: supple, non-tender without lymphadenopathy CV: regular rate and rhythm without murmurs, rubs, or gallops, no lower extremity edema Lungs: clear to auscultation bilaterally with normal work of breathing Abdomen: soft, non-tender, non-distended, no masses or organomegaly palpable, normoactive bowel sounds Skin: warm, dry, no rashes or lesions, cap refill < 2 seconds Extremities: warm and well perfused, normal tone  Assessment & Plan:   Mixed hyperlipidemia due to type 2 diabetes mellitus (HCC) Chronic. Controlled on metformin. On ACE-I, ASA, and statin therapy. --Continue metformin 850 mg BID, ASA 81 mg daily, Crestor 2.5 mg daily --Referral to optho placed for annual DM eye  exam  Preventative health care New pt, initial encounter. Last colonoscopy 1 polyp benign, overdue. Needs optho f/u. DM controlled. On statin and ASA. Last CMP and lipids reviewed for 06/2016. UTD on HIV. Uncertain HCV screen. --Continue Crestor 2.5 mg daily, ASA 81 mg daily --Given form to schedule screening colonoscopy --Diabetic foot exam performed and updated --Release of info sent to obtain vaccines, may need Tdap and PNA vaccines next visit  GERD (gastroesophageal reflux disease) Chronic. Controlled. On Prilosec. --Continue Prilosec 20 mg daily  Orders Placed This Encounter  Procedures  . Ambulatory referral to Ophthalmology    Referral Priority:   Routine    Referral Type:   Consultation    Referral Reason:   Specialty Services Required    Requested Specialty:   Ophthalmology    Number of Visits Requested:   1   Meds ordered this encounter  Medications  . mometasone (NASONEX) 50 MCG/ACT nasal spray    Sig: Place 2 sprays into the nose daily as needed.  . Chromium-Cinnamon (CINNAMON PLUS CHROMIUM) 100-500 MCG-MG CAPS    Sig: Take 1 capsule by mouth daily.  . Multiple Vitamins-Minerals (CENTRUM SILVER PO)    Sig: Take 1 capsule by mouth daily.  . Cetirizine HCl 10 MG CAPS    Sig: Take 1 capsule by mouth daily.  Marland Kitchen. FEXOFENADINE HCL PO    Sig: Take 180 mg by mouth daily as needed.  Kristin Bruins. SUPER B COMPLEX/C CAPS    Sig: Take 1 capsule by mouth daily.  Marland Kitchen. aspirin EC 81 MG tablet    Sig: Take 81 mg by mouth daily.    This note has been created with Education officer, environmentalDragon speech recognition software and smart phrase technology.  Any transcriptional errors are unintentional.  Durward Parcel, DO [facility], PGY-1 11/24/2016 6:18 PM

## 2016-12-01 ENCOUNTER — Encounter: Payer: Self-pay | Admitting: Family Medicine

## 2016-12-13 LAB — HM DIABETES EYE EXAM

## 2016-12-27 ENCOUNTER — Telehealth: Payer: Self-pay | Admitting: Family Medicine

## 2016-12-27 NOTE — Telephone Encounter (Signed)
Pt would like to get his labs done before upcoming appointment. If this is possible please call pt and let him know. ep

## 2016-12-28 NOTE — Telephone Encounter (Signed)
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 It also seems that he will only need a POCT A1C at the next visit which can be done the day of. I called patient to let him know.

## 2017-01-08 ENCOUNTER — Ambulatory Visit: Payer: BC Managed Care – PPO | Admitting: Family Medicine

## 2017-01-08 NOTE — Progress Notes (Deleted)
   Subjective:   Patient ID: Francisco Griffin    DOB: 09/29/1960, 56 y.o. male   MRN: [id number]  CC: "***"  HPI: Francisco Griffin is a 56 y.o. male who presents to clinic today ***. Problems discussed today are as follows:  ***: *** ROS: ***  ***Last seen 6/29 with instructions to continue metformin, aspirin, Crestor. Referral placed for diabetic eye exam. Given information for colonoscopy. Needs HIV & HCV screen, PNA and Tdap?  Complete ROS performed, see HPI for pertinent.  PMFSH: NIDDM, OA. Surgical history repair of right wrist fracture. Family history heart disease, pulmonary fibrosis, HLD, HTN, DM, asthma. Smoking status reviewed. Medications reviewed.  Objective:   There were no vitals taken for this visit. Vitals and nursing note reviewed.  General: well nourished, well developed, in no acute distress with non-toxic appearance HEENT: normocephalic, atraumatic, moist mucous membranes Neck: supple, non-tender without lymphadenopathy CV: regular rate and rhythm without murmurs, rubs, or gallops, no lower extremity edema Lungs: clear to auscultation bilaterally with normal work of breathing Abdomen: soft, non-tender, non-distended, no masses or organomegaly palpable, normoactive bowel sounds Skin: warm, dry, no rashes or lesions, cap refill < 2 seconds Extremities: warm and well perfused, normal tone  Assessment & Plan:   No problem-specific Assessment & Plan notes found for this encounter.  No orders of the defined types were placed in this encounter.  No orders of the defined types were placed in this encounter.   Durward Parcelavid Lorne Winkels, DO [facility], PGY-2 01/08/2017 1:44 PM

## 2017-01-09 NOTE — Progress Notes (Signed)
   Subjective:   Patient ID: Francisco Griffin    DOB: 04/10/1961, 56 y.o. male   MRN: [id number]  CC: "Preventative care"  HPI: Francisco BowelsJames Galloway is a 56 y.o. male who presents to clinic today for preventative care. Problems discussed today are as follows:  Preventative health care: Patient transferred care from [place] after move. Received documents for previous colonoscopy and recent lab work including CBC, CMP, lipid panel taken 06/2016.   Hyperlipidemia: Currently on Crestor 2.5 mg daily. Patient allergic to Lipitor with myalgias.   Sleep apnea: Currently on CPAP. Patient desires to establish care with a pulmonologist.  ROS: Denies chest pain, shortness of breath, cough, PND, orthopnea, fatigue.  Complete ROS performed, see HPI for pertinent.  PMFSH: NIDDM, OA. Surgical history repair of right wrist fracture. Family history heart disease, pulmonary fibrosis, HLD, HTN, DM, asthma. Smoking status reviewed. Medications reviewed.  Objective:   BP 108/70   Pulse 72   Temp 98.6 F (37 C) (Oral)   Ht 6' (1.829 m)   Wt 248 lb (112.5 kg)   SpO2 95%   BMI 33.63 kg/m  Vitals and nursing note reviewed.  General: well nourished, well developed, in no acute distress with non-toxic appearance HEENT: normocephalic, atraumatic, moist mucous membranes Neck: supple, non-tender without lymphadenopathy CV: regular rate and rhythm without murmurs, rubs, or gallops, no lower extremity edema Lungs: clear to auscultation bilaterally with normal work of breathing Abdomen: soft, non-tender, non-distended, no masses or organomegaly palpable, normoactive bowel sounds Skin: warm, dry, no rashes or lesions, cap refill < 2 seconds Extremities: warm and well perfused, normal tone  Assessment & Plan:   Obstructive sleep apnea Chronic. Diagnosed in [place] by pulmonologist but not yet established new care since move. Asymptomatic. --Continue CPAP nightly --Amb referral for pulmonology placed to  establish care for OSA  Hyperlipidemia due to type 2 diabetes mellitus (HCC) Chronic. Sub-optimal control. Mixed by lipid panel 06/2016 in [place]. LDL remains elevated.  --Increase Crestor to 5 mg daily --Will recheck LDL 06/2017 at next visit --RTC 6 months  Preventative health care Overdue for Tdap booster, PNA (DM), and asking for Shingrix (no prior shingles vaccine). Also needs lifetime HCV and HIV screen. --Will obtain HIV and HCV with reflex --Tdap and PNA given in office --Shingrix Rx given to be received at pharmacy  Orders Placed This Encounter  Procedures  . Tdap vaccine greater than or equal to 7yo IM  . Pneumococcal polysaccharide vaccine 23-valent greater than or equal to 2yo subcutaneous/IM  . Hepatitis C antibody  . HIV antibody (with reflex)  . Ambulatory referral to Pulmonology    Referral Priority:   Routine    Referral Type:   Consultation    Referral Reason:   Specialty Services Required    Requested Specialty:   Pulmonary Disease    Number of Visits Requested:   1   Meds ordered this encounter  Medications  . Zoster Vac Recomb Adjuvanted [facility](SHINGRIX) injection    Sig: Inject 0.5 mLs into the muscle once.    Dispense:  0.5 mL    Refill:  0    Durward Parcelavid Vuong Musa, DO [facility], PGY-2 01/12/2017 10:27 AM

## 2017-01-10 ENCOUNTER — Encounter: Payer: Self-pay | Admitting: Family Medicine

## 2017-01-11 ENCOUNTER — Encounter: Payer: Self-pay | Admitting: Family Medicine

## 2017-01-11 ENCOUNTER — Ambulatory Visit (INDEPENDENT_AMBULATORY_CARE_PROVIDER_SITE_OTHER): Payer: BC Managed Care – PPO | Admitting: Family Medicine

## 2017-01-11 VITALS — BP 108/70 | HR 72 | Temp 98.6°F | Ht 72.0 in | Wt 248.0 lb

## 2017-01-11 DIAGNOSIS — Z9989 Dependence on other enabling machines and devices: Secondary | ICD-10-CM

## 2017-01-11 DIAGNOSIS — Z114 Encounter for screening for human immunodeficiency virus [HIV]: Secondary | ICD-10-CM

## 2017-01-11 DIAGNOSIS — Z0000 Encounter for general adult medical examination without abnormal findings: Secondary | ICD-10-CM | POA: Diagnosis not present

## 2017-01-11 DIAGNOSIS — E785 Hyperlipidemia, unspecified: Secondary | ICD-10-CM | POA: Diagnosis not present

## 2017-01-11 DIAGNOSIS — G4733 Obstructive sleep apnea (adult) (pediatric): Secondary | ICD-10-CM

## 2017-01-11 DIAGNOSIS — E1169 Type 2 diabetes mellitus with other specified complication: Secondary | ICD-10-CM

## 2017-01-11 DIAGNOSIS — Z23 Encounter for immunization: Secondary | ICD-10-CM | POA: Diagnosis not present

## 2017-01-11 DIAGNOSIS — Z1159 Encounter for screening for other viral diseases: Secondary | ICD-10-CM | POA: Diagnosis not present

## 2017-01-11 MED ORDER — ZOSTER VAC RECOMB ADJUVANTED 50 MCG/0.5ML IM SUSR: 0.5000 mL | Freq: Once | INTRAMUSCULAR | 0 refills | Status: AC

## 2017-01-11 NOTE — Assessment & Plan Note (Addendum)
Chronic. Diagnosed in [place] by pulmonologist but not yet established new care since move. Asymptomatic. --Continue CPAP nightly --Amb referral for pulmonology placed to establish care for OSA

## 2017-01-11 NOTE — Patient Instructions (Addendum)
Thank you for coming in to see us today. Please see below to review our plan for today's visit.  1. If your blood work is abnormal I will call you, otherwise expect to receive your results in the mail. 2. Increase your Crestor to the full 5 mg tablet daily. If you begin to have muscle aches, discontinue this medication and let the clinic know. 3. You're now up-to-date on her vaccinations with exception to the shingles vaccine which I have provided you. 4. We will get additional blood work during her next visit when you're due in February. 5. Have placed a referral for you to see a pulmonologist to establish care for your sleep apnea.  Return to clinic in 6 months.  Please call the clinic at [phone] if your symptoms worsen or you have any concerns. It was my pleasure to see you. -- Durward Parcelavid McMullen, DO [facility], PGY-2

## 2017-01-11 NOTE — Assessment & Plan Note (Addendum)
Chronic. Sub-optimal control. Mixed by lipid panel 06/2016 in [place]. LDL remains elevated.  --Increase Crestor to 5 mg daily --Will recheck LDL 06/2017 at next visit --RTC 6 months

## 2017-01-11 NOTE — Assessment & Plan Note (Addendum)
Overdue for Tdap booster, PNA (DM), and asking for Shingrix (no prior shingles vaccine). Also needs lifetime HCV and HIV screen. --Will obtain HIV and HCV with reflex --Tdap and PNA given in office --Shingrix Rx given to be received at pharmacy

## 2017-01-12 ENCOUNTER — Encounter: Payer: Self-pay | Admitting: Family Medicine

## 2017-01-12 DIAGNOSIS — E119 Type 2 diabetes mellitus without complications: Secondary | ICD-10-CM | POA: Insufficient documentation

## 2017-01-12 LAB — HEPATITIS C ANTIBODY

## 2017-01-12 LAB — HIV ANTIBODY (ROUTINE TESTING W REFLEX): HIV SCREEN 4TH GENERATION: NONREACTIVE

## 2017-02-14 ENCOUNTER — Encounter: Payer: Self-pay | Admitting: Family Medicine

## 2017-02-20 ENCOUNTER — Encounter: Payer: Self-pay | Admitting: Family Medicine

## 2017-02-23 ENCOUNTER — Ambulatory Visit (HOSPITAL_COMMUNITY)
Admission: EM | Admit: 2017-02-23 | Discharge: 2017-02-23 | Disposition: A | Discharge status: Home / Self Care | Payer: BC Managed Care – PPO | Attending: Internal Medicine | Admitting: Internal Medicine

## 2017-02-23 ENCOUNTER — Encounter (HOSPITAL_COMMUNITY): Payer: Self-pay | Admitting: Family Medicine

## 2017-02-23 DIAGNOSIS — J0120 Acute ethmoidal sinusitis, unspecified: Secondary | ICD-10-CM | POA: Diagnosis not present

## 2017-02-23 MED ORDER — MOMETASONE FUROATE 50 MCG/ACT NA SUSP: 2.0000 | Freq: Every day | NASAL | 12 refills | Status: DC

## 2017-02-23 MED ORDER — AMOXICILLIN-POT CLAVULANATE 875-125 MG PO TABS: 1.0000 | ORAL_TABLET | Freq: Two times a day (BID) | ORAL | 0 refills | Status: AC

## 2017-02-23 NOTE — ED Triage Notes (Signed)
Pt here for almost a week of upper respiratory symptoms. taking multiple OTC drugs and no relief.

## 2017-02-24 NOTE — ED Provider Notes (Signed)
[facility]    CSN: [id number] Arrival date & time: 02/23/17  1636     History   Chief Complaint Chief Complaint  Patient presents with  . Cough  . Nasal Congestion    HPI Francisco Griffin is a 56 y.o. male.   Pt w/ PMH DM2 and OSA presents to clinic c/o right ear congestion and inflammation in his throat x1 week. Also admits to feeling pressure in his sinuses and occasional cough productive of thick yellowish green sputum. He has tried multiple OTC remedies with short term improvement but without consistent relief.       Past Medical History:  Diagnosis Date  . Allergy   . Anxiety   . Diabetes mellitus without complication [facility])     Patient Active Problem List   Diagnosis Date Noted  . Diabetes type 2, controlled (HCC) 01/12/2017  . 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 GERD (gastroesophageal reflux disease) 11/24/2016  . Preventative health care 11/24/2016  . Obstructive sleep apnea 11/24/2016  . Obesity (BMI 30.0-34.9) 11/24/2016  . Osteoarthritis of right knee 08/10/2016  . Degenerative tear of medial meniscus of right knee 08/10/2016  . Hyperlipidemia due to type 2 diabetes mellitus (HCC) 07/30/2014    Past Surgical History:  Procedure Laterality Date  . FRACTURE SURGERY     5th grade broke wrist       Home Medications    Prior to Admission medications   Medication Sig Start Date End Date Taking? Authorizing Provider  amoxicillin-clavulanate (AUGMENTIN) 875-125 MG tablet Take 1 tablet by mouth 2 (two) times daily. 02/23/17 03/05/17  Arnaldo Natal, MD  aspirin EC 81 MG tablet Take 81 mg by mouth daily.    [provider]  Cetirizine HCl 10 MG CAPS Take 1 capsule by mouth daily.    [provider]  Chromium-Cinnamon (CINNAMON PLUS CHROMIUM) 100-500 MCG-MG CAPS Take 1 capsule by mouth daily.    [provider]  FEXOFENADINE HCL PO Take 180 mg by mouth daily as needed.    [provider]    lisinopril (PRINIVIL,ZESTRIL) 2.5 MG tablet Take 2.5 mg by mouth daily.  06/29/14   [provider]  metFORMIN (GLUCOPHAGE) 850 MG tablet Take 850 mg by mouth 2 (two) times daily with a meal.    [provider]  mometasone (NASONEX) 50 MCG/ACT nasal spray Place 2 sprays into the nose daily as needed.    [provider]  mometasone (NASONEX) 50 MCG/ACT nasal spray Place 2 sprays into the nose daily. 02/23/17   Arnaldo Natal, MD  Multiple Vitamins-Minerals (CENTRUM SILVER PO) Take 1 capsule by mouth daily.    [provider]  omeprazole (PRILOSEC OTC) 20 MG tablet Take 20 mg by mouth daily.    [provider]  rosuvastatin (CRESTOR) 5 MG tablet Take 2.5 mg by mouth daily.    [provider]  SUPER B COMPLEX/C CAPS Take 1 capsule by mouth daily.    [provider]  valACYclovir (VALTREX) 500 MG tablet Take 500 mg by mouth daily. 06/02/16   [provider]    Family History Family History  Problem Relation Age of Onset  . Cancer Mother   . Heart disease Father   . Pulmonary fibrosis Father   . Hyperlipidemia Father   . Hypertension Father   . Diabetes Father   . Hyperlipidemia Sister   . Asthma Sister   . Cancer Maternal Grandfather   . Heart disease Paternal  Grandmother   . Diabetes Paternal Grandfather   . Heart disease Paternal Grandfather     Social History Social History  Substance Use Topics  . Smoking status: Never Smoker  . Smokeless tobacco: Never Used  . Alcohol use 0.6 oz/week    1 Cans of beer per week     Allergies   Lipitor [atorvastatin]   Review of Systems Review of Systems  Constitutional: Positive for fever (subjective). Negative for chills.  HENT: Positive for congestion, sinus pressure and sore throat. Negative for tinnitus.   Eyes: Negative for redness.  Respiratory: Positive for cough. Negative for shortness of breath.   Cardiovascular: Negative for chest pain and palpitations.   Gastrointestinal: Negative for abdominal pain, diarrhea, nausea and vomiting.  Genitourinary: Negative for dysuria, frequency and urgency.  Musculoskeletal: Negative for myalgias.  Skin: Negative for rash.       No lesions  Neurological: Negative for weakness.  Hematological: Does not bruise/bleed easily.  Psychiatric/Behavioral: Negative for suicidal ideas.     Physical Exam Triage Vital Signs ED Triage Vitals [02/23/17 1742]  Enc Vitals Group     BP 138/69     Pulse Rate 75     Resp 18     Temp 98.2 F (36.8 C)     Temp src      SpO2 97 %     Weight      Height      Head Circumference      Peak Flow      Pain Score      Pain Loc      Pain Edu?      Excl. in GC?    No data found.   Updated Vital Signs BP 138/69   Pulse 75   Temp 98.2 F (36.8 C)   Resp 18   SpO2 97%   Visual Acuity Right Eye Distance:   Left Eye Distance:   Bilateral Distance:    Right Eye Near:   Left Eye Near:    Bilateral Near:     Physical Exam  Constitutional: He is oriented to person, place, and time. He appears well-developed and well-nourished. No distress.  HENT:  Head: Normocephalic and atraumatic.  Mouth/Throat: Oropharynx is clear and moist.  Mid face sinus tenderness to palpation   Eyes: Pupils are equal, round, and reactive to light. Conjunctivae and EOM are normal. No scleral icterus.  Neck: Normal range of motion. Neck supple. No JVD present. No tracheal deviation present. No thyromegaly present.  Cardiovascular: Normal rate, regular rhythm and normal heart sounds.  Exam reveals no gallop and no friction rub.   No murmur heard. Pulmonary/Chest: Effort normal and breath sounds normal. No respiratory distress.  Abdominal: Soft. Bowel sounds are normal. He exhibits no distension. There is no tenderness.  Musculoskeletal: Normal range of motion. He exhibits no edema.  Lymphadenopathy:    He has no cervical adenopathy.  Neurological: He is alert and oriented to person,  place, and time. No cranial nerve deficit.  Skin: Skin is warm and dry. No rash noted. No erythema.  Psychiatric: He has a normal mood and affect. His behavior is normal. Judgment and thought content normal.     UC Treatments / Results  Labs (all labs ordered are listed, but only abnormal results are displayed) Labs Reviewed - No data to display  EKG  EKG Interpretation None       Radiology No results found.  Procedures Procedures (including critical care time)  Medications Ordered  in UC Medications - No data to display   Initial Impression / Assessment and Plan / UC Course  I have reviewed the triage vital signs and the nursing notes.  Pertinent labs & imaging results that were available during my care of the patient were reviewed by me and considered in my medical decision making (see chart for details).     Sinusitis; no indication for head CT. Abx + nasal steroid.   Final Clinical Impressions(s) / UC Diagnoses   Final diagnoses:  Subacute ethmoidal sinusitis    New Prescriptions Discharge Medication List as of 02/23/2017  6:58 PM    START taking these medications   Details  amoxicillin-clavulanate (AUGMENTIN) 875-125 MG tablet Take 1 tablet by mouth 2 (two) times daily., Starting Fri 02/23/2017, Until Mon 03/05/2017, Normal    !! mometasone (NASONEX) 50 MCG/ACT nasal spray Place 2 sprays into the nose daily., Starting Fri 02/23/2017, Normal     !! - Potential duplicate medications found. Please discuss with provider.       Controlled Substance Prescriptions [place] Controlled Substance Registry consulted? Not Applicable   Arnaldo Natal, MD 02/24/17 1356

## 2017-04-17 ENCOUNTER — Ambulatory Visit: Payer: BC Managed Care – PPO | Admitting: Pulmonary Disease

## 2017-04-17 ENCOUNTER — Encounter: Payer: Self-pay | Admitting: Pulmonary Disease

## 2017-04-17 DIAGNOSIS — G4733 Obstructive sleep apnea (adult) (pediatric): Secondary | ICD-10-CM

## 2017-04-17 NOTE — Patient Instructions (Signed)
CPAP is working well on current settings.  Obtain sleep study from [facility] or [facility].  Prescription for new auto CPAP 8-14 cm with humidity will be sent to [facility]

## 2017-04-17 NOTE — Assessment & Plan Note (Signed)
CPAP is working well on current settings.  Obtain sleep study from [facility] or [facility].  Prescription for new auto CPAP 8-14 cm with humidity will be sent to [facility]   The pathophysiology of obstructive sleep apnea , it's cardiovascular consequences & modes of treatment including CPAP were discused with the patient in detail & they evidenced understanding.  Weight loss encouraged, compliance with goal of at least 4-6 hrs every night is the expectation. Advised against medications with sedative side effects Cautioned against driving when sleepy - understanding that sleepiness will vary on a day to day basis

## 2017-04-17 NOTE — Progress Notes (Signed)
Subjective:    Patient ID: Francisco Griffin, male    DOB: May 10, 1961, 56 y.o.   MRN: [id number]  HPI Chief Complaint  Patient presents with  . Sleep Consult    Referred by Dr. Abelardo DieselMcMullen for OSA. Had a SS back in 2012. Is currently using a CPAP machine at home.     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  [place]A [place] [facility] in [facility], he was told that he stopped breathing 40-50 times an hour and will set up with CPAP since then he was placed on auto settings 8-14 cm and he had tremendous improvement in his daytime somnolence and fatigue, he felt as if her father had lifted from around his head and his headaches disappeared   He reports good compliance with his CPAP machine and full facemask. Epworth sleepiness score is 6 Bedtime is between 1030 and 11:30 PM, sleep latency is less than 15 minutes, sleeps on his back with one pillow, reports 1-2 nocturnal awakenings including nocturia and is out of bed by 6:30 AM feeling rested without dryness of mouth or headaches.  He had gained a few but now has lost 20 pounds again to his current weight.  His DME is [facility] There is no history suggestive of cataplexy, sleep paralysis or parasomnias He has diabetes controlled on metformin Blood pressure is slight high today, he takes low-dose lisinopril  Past Medical History:  Diagnosis Date  . Allergy   . Anxiety   . Diabetes mellitus without complication [facility](HCC)     Past Surgical History:  Procedure Laterality Date  . FRACTURE SURGERY     5th grade broke wrist    Allergies  Allergen Reactions  . Lipitor [Atorvastatin] Other (See Comments)    Muscle aches across his chest     Social History   Socioeconomic History  . Marital status: Divorced    Spouse name: Not on file  . Number of children: Not on file  . Years of education: Not on file  . Highest education level: Not on file  Social Needs  .  Financial resource strain: Not on file  . Food insecurity - worry: Not on file  . Food insecurity - inability: Not on file  . Transportation needs - medical: Not on file  . Transportation needs - non-medical: Not on file  Occupational History  . Not on file  Tobacco Use  . Smoking status: Never Smoker  . Smokeless tobacco: Never Used  Substance and Sexual Activity  . Alcohol use: Yes    Alcohol/week: 0.6 oz    Types: 1 Cans of beer per week  . Drug use: No  . Sexual activity: Not on file  Other Topics Concern  . Not on file  Social History Narrative  . Not on file     Family History  Problem Relation Age of Onset  . Cancer Mother   . Heart disease Father   . Pulmonary fibrosis Father   . Hyperlipidemia Father   . Hypertension Father   . Diabetes Father   . Hyperlipidemia Sister   . Asthma Sister   . Cancer Maternal Grandfather   . Heart disease Paternal Grandmother   . Diabetes Paternal Grandfather   . Heart disease Paternal Grandfather      Review of Systems Positive for indigestion, nasal congestion sneezing, anxiety, joint stiffness.  Constitutional: negative for anorexia, fevers and sweats  Eyes: negative for irritation, redness  and visual disturbance  Ears, nose, mouth, throat, and face: negative for earaches, epistaxis, nasal congestion and sore throat  Respiratory: negative for cough, dyspnea on exertion, sputum and wheezing  Cardiovascular: negative for chest pain, dyspnea, lower extremity edema, orthopnea, palpitations and syncope  Gastrointestinal: negative for abdominal pain, constipation, diarrhea, melena, nausea and vomiting  Genitourinary:negative for dysuria, frequency and hematuria  Hematologic/lymphatic: negative for bleeding, easy bruising and lymphadenopathy  Musculoskeletal:negative for arthralgias, muscle weakness and stiff joints  Neurological: negative for coordination problems, gait problems, headaches and weakness  Endocrine: negative for  diabetic symptoms including polydipsia, polyuria and weight loss       Objective:   Physical Exam  Gen. Pleasant, obese, in no distress, normal affect ENT - no lesions, no post nasal drip, class 2-3 airway Neck: No JVD, no thyromegaly, no carotid bruits Lungs: no use of accessory muscles, no dullness to percussion, decreased without rales or rhonchi  Cardiovascular: Rhythm regular, heart sounds  normal, no murmurs or gallops, no peripheral edema Abdomen: soft and non-tender, no hepatosplenomegaly, BS normal. Musculoskeletal: No deformities, no cyanosis or clubbing Neuro:  alert, non focal, no tremors       Assessment & Plan:

## 2017-10-03 LAB — HM DIABETES EYE EXAM

## 2017-10-08 ENCOUNTER — Telehealth: Payer: Self-pay

## 2017-10-08 NOTE — Progress Notes (Signed)
Subjective   Patient ID: Francisco Griffin    DOB: 10/28/1960, 57 y.o. male   MRN: [id number]  CC: " Medication refill"  HPI: Francisco Griffin is a 57 y.o. male who presents to clinic today for the following:  Physical exam: Patient is here today for his annual wellness screening.  He states that he needs refills on his medications since [place] providers from [place] state but needs to be evaluated for each medication refill.  Patient has no complaints today.  He denies fevers or chills, headache, palpitations, chest pain, shortness of breath, nausea or vomiting, abdominal pain, diarrhea or constipation, syncope or feelings of fatigue.  Diabetes: Currently tolerating metformin only.  Has been well controlled in the past.  Here today for recheck his A1c.  Has been avoiding excessive carbohydrate intake.  Does not routinely exercise.  Hyperlipidemia: Increase Crestor as instructed to 5 mg daily since last visit in August.  No family history of early MI or stroke/TIA.  Patient does have a family history of MI with father and grandfather in their 49s.  Patient denies personal history of such.  HSV-2: Currently taking Valtrex daily for the past 3 years.  Has tried intermittent use for outbreaks but seems to be poorly controlled.  Needs refill today.  GERD: Has been using Prilosec regularly for reflux.  Typically has dinner approximately 2 hours before bedtime.  Has been taking occasional breaks from medication.  ROS: see HPI for pertinent.  PMFSH: NIDDM, HLD, GERD, OSA, OA, obesity, HSV-2. Surgical history repair of right wrist fracture. Family history heart disease, pulmonary fibrosis, HLD, HTN, DM, asthma. Smoking status reviewed. Medications reviewed.  Objective   BP 121/89 (BP Location: Right Arm, Patient Position: Sitting, Cuff Size: Normal)   Pulse 72   Temp 98.5 F (36.9 C) (Oral)   Ht 6' (1.829 m)   Wt 235 lb 9.6 oz (106.9 kg)   SpO2 98%   BMI 31.95 kg/m  Vitals and nursing note  reviewed.  General: well nourished, well developed, NAD with non-toxic appearance HEENT: normocephalic, atraumatic, moist mucous membranes Neck: supple, non-tender without lymphadenopathy Cardiovascular: regular rate and rhythm without murmurs, rubs, or gallops Lungs: clear to auscultation bilaterally with normal work of breathing Abdomen: soft, non-tender, non-distended, normoactive bowel sounds Skin: warm, dry, no rashes or lesions, cap refill < 2 seconds Extremities: warm and well perfused, normal tone, no edema  Assessment & Plan   Controlled type 2 diabetes mellitus without complication, without long-term current use of insulin (HCC) Chronic.  Well-controlled metformin only.  A1c 5.8.  Making dietary changes.  On daily aspirin and statin therapy. 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  Suboptimal control.  LDL elevated at 108 with high triglyceride level despite increasing high intensity statin therapy over the last 6 months.  Does have increased risk due to known diabetes though well controlled. - Increasing Crestor to 10 mg daily  GERD (gastroesophageal reflux disease) Chronic.  Has history of PPI use intermittently.  Currently asymptomatic. 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  Well-controlled.  No active outbreaks. - Continue Valtrex daily  Orders Placed This Encounter  Procedures  . HgB A1c   Meds ordered  this encounter  Medications  . lisinopril (PRINIVIL,ZESTRIL) 2.5 MG tablet    Sig: Take 1 tablet (2.5 mg total) by mouth daily.    Dispense:  90 tablet    Refill:  3  . metFORMIN (GLUCOPHAGE) 850 MG tablet     Sig: Take 1 tablet (850 mg total) by mouth 2 (two) times daily with a meal.    Dispense:  180 tablet    Refill:  3  . omeprazole (PRILOSEC OTC) 20 MG tablet    Sig: Take 1 tablet (20 mg total) by mouth daily as needed.    Dispense:  30 tablet    Refill:  0  . rosuvastatin (CRESTOR) 10 MG tablet    Sig: Take 1 tablet (10 mg total) by mouth daily.    Dispense:  90 tablet    Refill:  3  . valACYclovir (VALTREX) 500 MG tablet    Sig: Take 1 tablet (500 mg total) by mouth daily.    Dispense:  90 tablet    Refill:  3    Durward Parcel, DO [facility], PGY-2 10/11/2017, 4:48 PM

## 2017-10-08 NOTE — Telephone Encounter (Signed)
I pended the labs. Please call him and have him come in fasting tomorrow. Otherwise he can come in anytime the day of his appointment to get labs if he wants to do it ahead of time. Thank you.  Durward Parcel, DO [facility], PGY-2

## 2017-10-08 NOTE — Telephone Encounter (Signed)
Pt left message on nurse line stating he has appt 5/16 but would like to have fasting labs drawn prior to his appointment either tomorrow or Wednesday. Please let him know if he can have this done. [phone] Shawna Orleans, RN

## 2017-10-09 NOTE — Telephone Encounter (Signed)
Pt informed and scheduled for an appt. Shadia Larose, CMA  

## 2017-10-10 ENCOUNTER — Other Ambulatory Visit: Payer: Self-pay | Admitting: Family Medicine

## 2017-10-10 ENCOUNTER — Other Ambulatory Visit: Payer: BC Managed Care – PPO

## 2017-10-10 DIAGNOSIS — E119 Type 2 diabetes mellitus without complications: Secondary | ICD-10-CM

## 2017-10-11 ENCOUNTER — Encounter: Payer: Self-pay | Admitting: Family Medicine

## 2017-10-11 ENCOUNTER — Ambulatory Visit (INDEPENDENT_AMBULATORY_CARE_PROVIDER_SITE_OTHER): Payer: BC Managed Care – PPO | Admitting: Family Medicine

## 2017-10-11 VITALS — BP 121/89 | HR 72 | Temp 98.5°F | Ht 72.0 in | Wt 235.6 lb

## 2017-10-11 DIAGNOSIS — E1169 Type 2 diabetes mellitus with other specified complication: Secondary | ICD-10-CM

## 2017-10-11 DIAGNOSIS — B009 Herpesviral infection, unspecified: Secondary | ICD-10-CM | POA: Diagnosis not present

## 2017-10-11 DIAGNOSIS — Z0000 Encounter for general adult medical examination without abnormal findings: Secondary | ICD-10-CM

## 2017-10-11 DIAGNOSIS — E119 Type 2 diabetes mellitus without complications: Secondary | ICD-10-CM

## 2017-10-11 DIAGNOSIS — E785 Hyperlipidemia, unspecified: Secondary | ICD-10-CM

## 2017-10-11 DIAGNOSIS — K219 Gastro-esophageal reflux disease without esophagitis: Secondary | ICD-10-CM | POA: Diagnosis not present

## 2017-10-11 LAB — CMP14+EGFR
ALK PHOS: 51 IU/L (ref 39–117)
ALT: 20 IU/L (ref 0–44)
AST: 21 IU/L (ref 0–40)
Albumin/Globulin Ratio: 1.6 (ref 1.2–2.2)
Albumin: 4.4 g/dL (ref 3.5–5.5)
BUN/Creatinine Ratio: 20 (ref 9–20)
BUN: 19 mg/dL (ref 6–24)
Bilirubin Total: 0.3 mg/dL (ref 0.0–1.2)
CALCIUM: 9.8 mg/dL (ref 8.7–10.2)
CHLORIDE: 101 mmol/L (ref 96–106)
CO2: 22 mmol/L (ref 20–29)
Creatinine, Ser: 0.97 mg/dL (ref 0.76–1.27)
GFR calc Af Amer: 100 mL/min/{1.73_m2} (ref >59)
GFR, EST NON AFRICAN AMERICAN: 86 mL/min/{1.73_m2} (ref >59)
Globulin, Total: 2.8 g/dL (ref 1.5–4.5)
Glucose: 118 mg/dL — ABNORMAL HIGH (ref 65–99)
POTASSIUM: 4.5 mmol/L (ref 3.5–5.2)
Sodium: 140 mmol/L (ref 134–144)
Total Protein: 7.2 g/dL (ref 6.0–8.5)

## 2017-10-11 LAB — CBC WITH DIFFERENTIAL/PLATELET
BASOS ABS: 0.1 10*3/uL (ref 0.0–0.2)
Basos: 1 %
EOS (ABSOLUTE): 0.2 10*3/uL (ref 0.0–0.4)
Eos: 2 %
HEMOGLOBIN: 14.4 g/dL (ref 13.0–17.7)
Hematocrit: 41.2 % (ref 37.5–51.0)
Immature Grans (Abs): 0 10*3/uL (ref 0.0–0.1)
Immature Granulocytes: 0 %
LYMPHS ABS: 4.5 10*3/uL — AB (ref 0.7–3.1)
LYMPHS: 45 %
MCH: 32.1 pg (ref 26.6–33.0)
MCHC: 35 g/dL (ref 31.5–35.7)
MCV: 92 fL (ref 79–97)
MONOS ABS: 0.6 10*3/uL (ref 0.1–0.9)
Monocytes: 6 %
NEUTROS PCT: 46 %
Neutrophils Absolute: 4.5 10*3/uL (ref 1.4–7.0)
Platelets: 330 10*3/uL (ref 150–379)
RBC: 4.48 x10E6/uL (ref 4.14–5.80)
RDW: 14.5 % (ref 12.3–15.4)
WBC: 9.8 10*3/uL (ref 3.4–10.8)

## 2017-10-11 LAB — LIPID PANEL
CHOL/HDL RATIO: 4.1 ratio (ref 0.0–5.0)
Cholesterol, Total: 185 mg/dL (ref 100–199)
HDL: 45 mg/dL (ref >39)
LDL CALC: 106 mg/dL — AB (ref 0–99)
Triglycerides: 168 mg/dL — ABNORMAL HIGH (ref 0–149)
VLDL CHOLESTEROL CAL: 34 mg/dL (ref 5–40)

## 2017-10-11 LAB — POCT GLYCOSYLATED HEMOGLOBIN (HGB A1C): HEMOGLOBIN A1C: 5.8

## 2017-10-11 LAB — TSH: TSH: 2.65 u[IU]/mL (ref 0.450–4.500)

## 2017-10-11 MED ORDER — ROSUVASTATIN CALCIUM 10 MG PO TABS: 10.0000 mg | ORAL_TABLET | Freq: Every day | ORAL | 3 refills | Status: DC

## 2017-10-11 MED ORDER — LISINOPRIL 2.5 MG PO TABS: 2.5000 mg | ORAL_TABLET | Freq: Every day | ORAL | 3 refills | Status: DC

## 2017-10-11 MED ORDER — VALACYCLOVIR HCL 500 MG PO TABS: 500.0000 mg | ORAL_TABLET | Freq: Every day | ORAL | 3 refills | Status: DC

## 2017-10-11 MED ORDER — OMEPRAZOLE MAGNESIUM 20 MG PO TBEC: 20.0000 mg | DELAYED_RELEASE_TABLET | Freq: Every day | ORAL | 0 refills | Status: DC | PRN

## 2017-10-11 MED ORDER — METFORMIN HCL 850 MG PO TABS: 850.0000 mg | ORAL_TABLET | Freq: Two times a day (BID) | ORAL | 3 refills | Status: DC

## 2017-10-11 NOTE — Assessment & Plan Note (Addendum)
Chronic.  Well-controlled metformin only.  A1c 5.8.  Making dietary changes.  On daily aspirin and statin therapy. - Continue metformin 850 mg twice daily - Encouraged to continue avoiding excessive carbohydrate intake and to incorporate exercise to achieve 150 minutes of moderate to high intensity exercise throughout the week

## 2017-10-11 NOTE — Patient Instructions (Signed)
Thank you for coming in to see [place] today. Please see below to review our plan for today's visit.  1.  I sent in the refills for all of your medications. 2.  Discontinue the omeprazole for reflux.  You can use over-the-counter Zantac as needed for immediate relief on an as-needed basis.  Reserve the omeprazole for severe reflux and take sparingly.  Try to space her last meal and drink approximately 3 hours before bedtime. 3.  Your diabetes is well controlled today.  I am agreeable to meet with you in 6 months to recheck your diabetes.  Continue your efforts with diet and exercise.  There is a chance we can get you off metformin. 4.  Your labs overall looked well with exception of your cholesterol.  We will increase your Crestor to 10 mg daily.  Please call the clinic at [phone] if your symptoms worsen or you have any concerns. It was our pleasure to serve you.  Durward Parcel, DO [facility], PGY-2

## 2017-10-11 NOTE — Assessment & Plan Note (Signed)
Chronic.  Has history of PPI use intermittently.  Currently asymptomatic. - Advised to initiate Zyrtec as needed and discontinue Prilosec and use for breakthrough on an as-needed basis

## 2017-10-11 NOTE — Assessment & Plan Note (Signed)
Chronic.  Suboptimal control.  LDL elevated at 108 with high triglyceride level despite increasing high intensity statin therapy over the last 6 months.  Does have increased risk due to known diabetes though well controlled. - Increasing Crestor to 10 mg daily

## 2017-10-11 NOTE — Assessment & Plan Note (Signed)
Chronic.  Well-controlled.  No active outbreaks. - Continue Valtrex daily

## 2017-11-02 ENCOUNTER — Other Ambulatory Visit: Payer: Self-pay | Admitting: Family Medicine

## 2017-11-02 DIAGNOSIS — K219 Gastro-esophageal reflux disease without esophagitis: Secondary | ICD-10-CM

## 2017-11-27 ENCOUNTER — Ambulatory Visit: Payer: BC Managed Care – PPO | Admitting: Family Medicine

## 2017-11-27 VITALS — BP 150/80 | HR 74 | Temp 98.8°F | Ht 72.0 in | Wt 235.8 lb

## 2017-11-27 DIAGNOSIS — H6691 Otitis media, unspecified, right ear: Secondary | ICD-10-CM | POA: Insufficient documentation

## 2017-11-27 DIAGNOSIS — H60391 Other infective otitis externa, right ear: Secondary | ICD-10-CM | POA: Diagnosis not present

## 2017-11-27 MED ORDER — NEOMYCIN-POLYMYXIN-HC 1 % OT SOLN: 3.0000 [drp] | Freq: Four times a day (QID) | OTIC | 0 refills | Status: DC

## 2017-11-27 MED ORDER — AMOXICILLIN-POT CLAVULANATE 875-125 MG PO TABS: 1.0000 | ORAL_TABLET | Freq: Two times a day (BID) | ORAL | 0 refills | Status: AC

## 2017-11-27 NOTE — Assessment & Plan Note (Signed)
Has had multiple similar complaints in past per patient, no matoiditis/fever/oral symptoms  Rx'd cortisporin drops

## 2017-11-27 NOTE — Patient Instructions (Signed)
It was a pleasure to see you today! Thank you for choosing [facility] for your primary care. Francisco Griffin was seen for ear infection. Come back to the clinic if you have any new complaints or don't get better within a week, and go to the emergency room if you have any difficulty swallowing or breathing.   We have sent some ear drops and a oral medication to your pharmacy   If we did any lab work today that did not result today, one of two things will happen.  1. If everything is normal, you will get a letter in mail sent to the address in your chart with the results for your records.  It is important to keep your address up to date as that is where we will send results.  2. If the results require some sort of discussion, my nurses or myself will call you on the phone number listed in your records.  It is important to keep your phone number up to date in our system as this is how we will try to reach you.  If we cannot reach you on the phone, we will try to send you a letter in the mail so please enable to voicemail function of your phone.  If you don't hear from us in two weeks, please give us a call to verify your results. Otherwise, we look forward to seeing you again at your next visit. If you have any questions or concerns before then, please call the clinic at [phone].   Please bring all your medications to every doctors visit   Sign up for My Chart to have easy access to your labs results, and communication with your Primary care physician.     Please check-out at the front desk before leaving the clinic.     Best,  Dr. Marthenia RollingScott Breindy Meadow FAMILY MEDICINE RESIDENT - PGY1 11/27/2017 2:32 PM

## 2017-11-27 NOTE — Assessment & Plan Note (Signed)
Has had multiple similar complaints in past per patient, no matoiditis/fever/oral symptoms  Rx's augmentin 800/125 BID x7days

## 2017-11-27 NOTE — Progress Notes (Signed)
    Subjective:  Francisco Griffin is a 57 y.o. male who presents to the [facility] today with a chief complaint of ear infection.   HPI: He went swimming a week ago and has slowly developed an ear pain consistent with muiltiple prior ear infections.  Sometimes those have been treated with drops and sometimes with oral meds in the past.   He tried to use vinegar drops after swimming to "dry out" his ear but it didn't seem to work this time.  He has som minor hearing changes, no nasal/visual symptoms.  No difficulty swallowing or breathing.  He has not had a fever and he has no bony complaints or tenderness  Objective:  Physical Exam: BP (!) 150/80   Pulse 74   Temp 98.8 F (37.1 C) (Oral)   Ht 6' (1.829 m)   Wt 235 lb 12.8 oz (107 kg)   SpO2 97%   BMI 31.98 kg/m   Gen: NAD, resting comfortably Right ear with irritation/purulence in canal consistent with otitis externa and white bulging TM Mouth: no LAD, uvula midline, no sublingual pain/deformity, no tooth pain Pulm: NWOB, regular breathing rate MSK: no edema, cyanosis, or clubbing noted Skin: warm, dry Neuro: grossly normal, moves all extremities Psych: Normal affect and thought content, pleasant  No results found for this or any previous visit (from the past 72 hour(s)).   Assessment/Plan:  Infective otitis externa of right ear Has had multiple similar complaints in past per patient, no matoiditis/fever/oral symptoms  Rx'd cortisporin drops  Right otitis media Has had multiple similar complaints in past per patient, no matoiditis/fever/oral symptoms  Rx's augmentin 800/125 BID x7days   Marthenia RollingScott Ashby Leflore, DO FAMILY MEDICINE RESIDENT - PGY1 11/27/2017 2:39 PM

## 2018-04-01 ENCOUNTER — Encounter: Payer: Self-pay | Admitting: Family Medicine

## 2018-04-30 ENCOUNTER — Ambulatory Visit: Payer: BC Managed Care – PPO | Admitting: Family Medicine

## 2018-05-07 ENCOUNTER — Encounter: Payer: Self-pay | Admitting: Pulmonary Disease

## 2018-05-07 ENCOUNTER — Ambulatory Visit (INDEPENDENT_AMBULATORY_CARE_PROVIDER_SITE_OTHER): Payer: BC Managed Care – PPO | Admitting: Pulmonary Disease

## 2018-05-07 DIAGNOSIS — J309 Allergic rhinitis, unspecified: Secondary | ICD-10-CM | POA: Insufficient documentation

## 2018-05-07 DIAGNOSIS — Z23 Encounter for immunization: Secondary | ICD-10-CM | POA: Diagnosis not present

## 2018-05-07 DIAGNOSIS — G4733 Obstructive sleep apnea (adult) (pediatric): Secondary | ICD-10-CM | POA: Diagnosis not present

## 2018-05-07 DIAGNOSIS — J3089 Other allergic rhinitis: Secondary | ICD-10-CM

## 2018-05-07 NOTE — Assessment & Plan Note (Signed)
Prescription for new auto CPAP 8-14 cm with humidity  He is certainly very compliant and CPAP is helped improve his daytime somnolence and fatigue  [facility] If unable then we will proceed with home sleep test.  Weight loss encouraged, compliance with goal of at least 4-6 hrs every night is the expectation. Advised against medications with sedative side effects Cautioned against driving when sleepy - understanding that sleepiness will vary on a day to day basis

## 2018-05-07 NOTE — Assessment & Plan Note (Addendum)
Call us if you would like ENT referral for FESS Flu shot today Asked him to resume Nasonex every other day, continue Allegra

## 2018-05-07 NOTE — Patient Instructions (Addendum)
Prescription for new auto CPAP 8-14 cm with humidity   Flu shot today. Obt[facility] sleep [facility]  Call us if you would like ENT referral

## 2018-05-07 NOTE — Progress Notes (Signed)
   Subjective:    Patient ID: Francisco Griffin, male    DOB: 1961/05/23, 57 y.o.   MRN: [id number]  HPI   Chief Complaint  Patient presents with  . Follow-up    pt states that he would like to see about an upgraded CPAP. Pt states that he feels his allergies is elevated at night and would like to know if he could get something for congestion.   57 year old man for follow-up of OSA He has been maintained on CPAP since 2013 [facility].  [place] CPAP settings of 8 to 14 cm with good control of events and good compliance. He requests a new machine today because his machine is getting old.  He also complains of increased nasal stuffiness in spite of increasing humidity to the maximum level No problems with mask or pressure.  He has problems with allergies for a long time, has seen an allergist, nasal steroids caused some bleeding.  He takes Allegra twice daily.  Is considering nasal surgery  He was last seen 03/2017 and we have been unable to track down his sleep study. CPAP download was again reviewed which shows good control of events on average pressure of 13 cm with excellent compliance and mild leak  Past Medical History:  Diagnosis Date  . Allergy   . Anxiety   . Diabetes mellitus without complication (HCC)      Review of Systems neg for any significant sore throat, dysphagia, itching, sneezing, nasal congestion or excess/ purulent secretions, fever, chills, sweats, unintended wt loss, pleuritic or exertional cp, hempoptysis, orthopnea pnd or change in chronic leg swelling. Also denies presyncope, palpitations, heartburn, abdominal pain, nausea, vomiting, diarrhea or change in bowel or urinary habits, dysuria,hematuria, rash, arthralgias, visual complaints, headache, numbness weakness or ataxia.     Objective:   Physical Exam   Gen. Pleasant, obese, in no distress ENT - no lesions, no post nasal drip Neck: No  JVD, no thyromegaly, no carotid bruits Lungs: no use of accessory muscles, no dullness to percussion, decreased without rales or rhonchi  Cardiovascular: Rhythm regular, heart sounds  normal, no murmurs or gallops, no peripheral edema Musculoskeletal: No deformities, no cyanosis or clubbing , no tremors        Assessment & Plan:

## 2018-05-09 ENCOUNTER — Telehealth: Payer: Self-pay | Admitting: Pulmonary Disease

## 2018-05-09 DIAGNOSIS — G4733 Obstructive sleep apnea (adult) (pediatric): Secondary | ICD-10-CM

## 2018-05-09 NOTE — Telephone Encounter (Signed)
Patient was in to see RA on 05/07/18 to discuss getting an updated CPAP machine. Patient [facility] [place]. I have faxed a signed medical release to them to obtain a copy so that we can order the machine for him.   Will keep signed release in my look-at folder until this has been received.   Will also go ahead and place order for him to receive a new CPAP machine from [facility].

## 2018-05-31 ENCOUNTER — Ambulatory Visit: Payer: BC Managed Care – PPO | Admitting: Family Medicine

## 2018-05-31 ENCOUNTER — Encounter: Payer: Self-pay | Admitting: Family Medicine

## 2018-05-31 VITALS — BP 119/80 | HR 76 | Temp 97.6°F | Wt 240.2 lb

## 2018-05-31 DIAGNOSIS — E119 Type 2 diabetes mellitus without complications: Secondary | ICD-10-CM

## 2018-05-31 LAB — POCT GLYCOSYLATED HEMOGLOBIN (HGB A1C): HbA1c, POC (prediabetic range): 5.9 % (ref 5.7–6.4)

## 2018-05-31 NOTE — Progress Notes (Signed)
   Subjective   Patient ID: Francisco BowelsJames Fodor    DOB: Oct 11, 1960, 58 y.o. male   MRN: [id number]  CC: "Diabetes"  HPI: Francisco Griffin is a 58 y.o. male who presents to clinic today for the following:  Diabetes: Patient presenting for routine diabetes follow-up.  No history of insulin use.  Currently tolerating metformin.  No known nephropathy, retinopathy, or neuropathy.  Has recently seen ophthalmology within the last year.  Patient making efforts to restrict carbohydrate intake with intermittent fasting.  He tries to remain active daily.  Currently on aspirin and lisinopril along with a statin.  ROS: see HPI for pertinent.  PMFSH: NIDDM, HLD, GERD, OSA, OA, obesity, HSV-2. Surgical history repair of right wrist fracture. Family history heart disease, pulmonary fibrosis, HLD, HTN, DM, asthma. Smoking status reviewed. Medications reviewed.  Objective   BP 119/80 (BP Location: Left Arm, Patient Position: Sitting, Cuff Size: Normal)   Pulse 76   Temp 97.6 F (36.4 C) (Oral)   Wt 240 lb 3.2 oz (109 kg)   SpO2 99%   BMI 32.58 kg/m  Vitals and nursing note reviewed.  General: well nourished, well developed, NAD with non-toxic appearance HEENT: normocephalic, atraumatic, moist mucous membranes Neck: supple, non-tender without lymphadenopathy Cardiovascular: regular rate and rhythm without murmurs, rubs, or gallops Lungs: clear to auscultation bilaterally with normal work of breathing Abdomen: soft, non-tender, non-distended, normoactive bowel sounds Skin: warm, dry, no rashes or lesions, cap refill < 2 seconds Extremities: warm and well perfused, normal tone, no edema  Assessment & Plan   Controlled type 2 diabetes mellitus without complication, without long-term current use of insulin (HCC) Chronic.  Remains well controlled on metformin alone.  No known complications. - Given history of good control, RTC 6 months or sooner if needed - Continue metformin 850 mg twice daily - Patient to  provide paperwork from last ophthalmology visit  Orders Placed This Encounter  Procedures  . HgB A1c   No orders of the defined types were placed in this encounter.   Durward Parcelavid , DO [facility], PGY-3 06/01/2018, 10:54 AM

## 2018-05-31 NOTE — Patient Instructions (Signed)
Thank you for coming in to see [place] today. Please see below to review our plan for today's visit.  You are doing well with controlling you diabetes. Follow up in 6 months or soon if needed. You can see me sooner if your knees become more problematic.  Please call the clinic at [phone] if your symptoms worsen or you have any concerns. It was our pleasure to serve you.  Durward Parcel, DO [facility], PGY-3

## 2018-05-31 NOTE — Progress Notes (Signed)
a1c

## 2018-06-01 NOTE — Assessment & Plan Note (Signed)
Chronic.  Remains well controlled on metformin alone.  No known complications. - Given history of good control, RTC 6 months or sooner if needed - Continue metformin 850 mg twice daily - Patient to provide paperwork from last ophthalmology visit

## 2018-06-06 ENCOUNTER — Encounter: Payer: Self-pay | Admitting: Family Medicine

## 2018-08-27 IMAGING — DX DG KNEE COMPLETE 4+V*R*
4 series · 4 of 4 positions shown · non-contrast
Comparison: None.

CLINICAL DATA: Acute anterior Rt knee pain x 2 weeks, no known
injury, pt had cortisone injection in Rt knee today

EXAM:
RIGHT KNEE - COMPLETE 4+ VIEW

[dg knee complete 4 views right (1 of 4)]
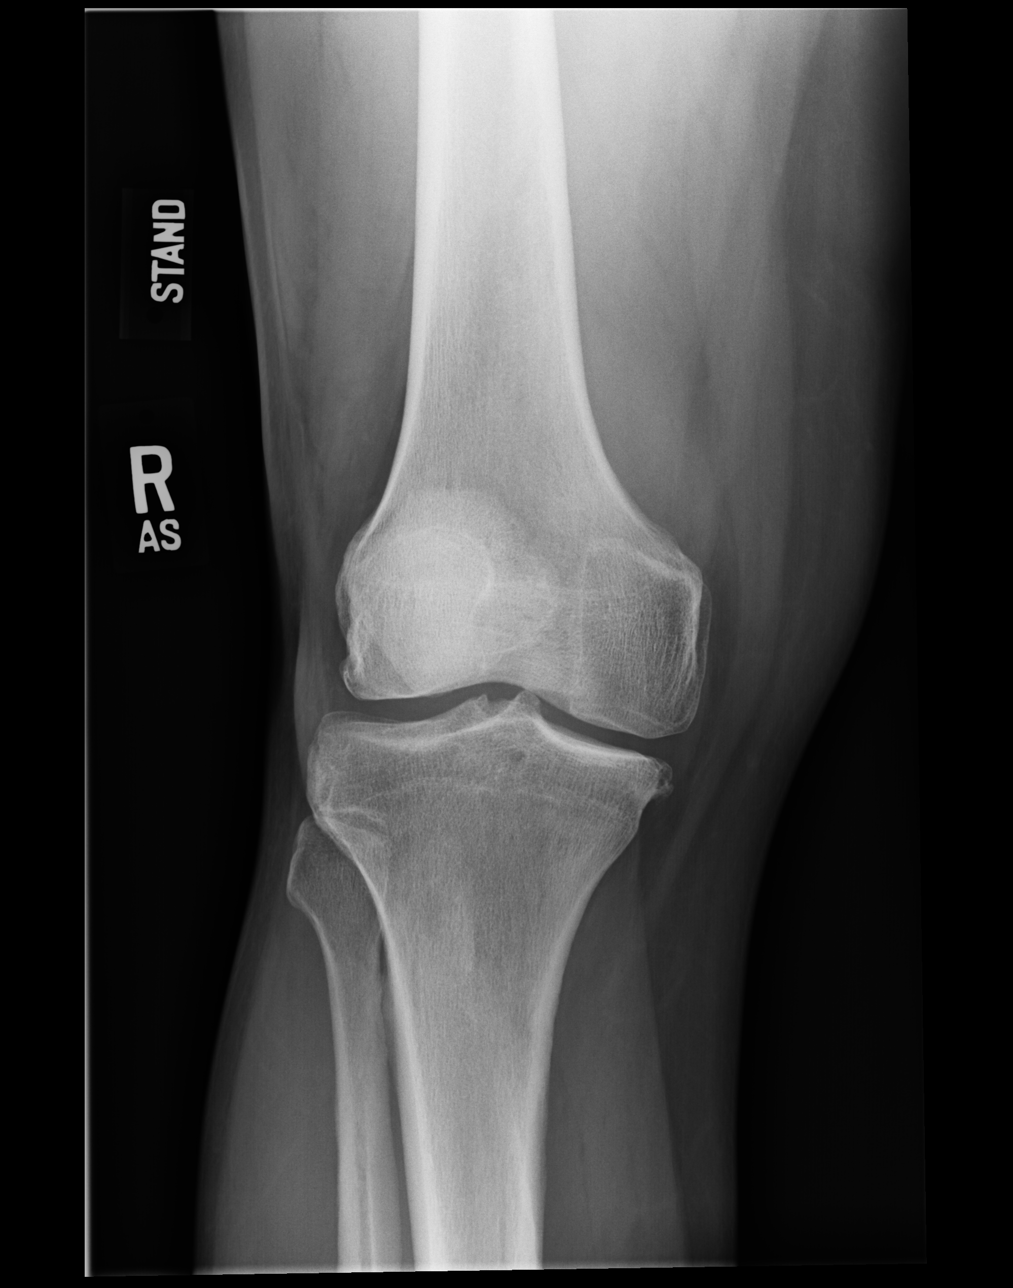

[dg knee complete 4 views right (2 of 4)]
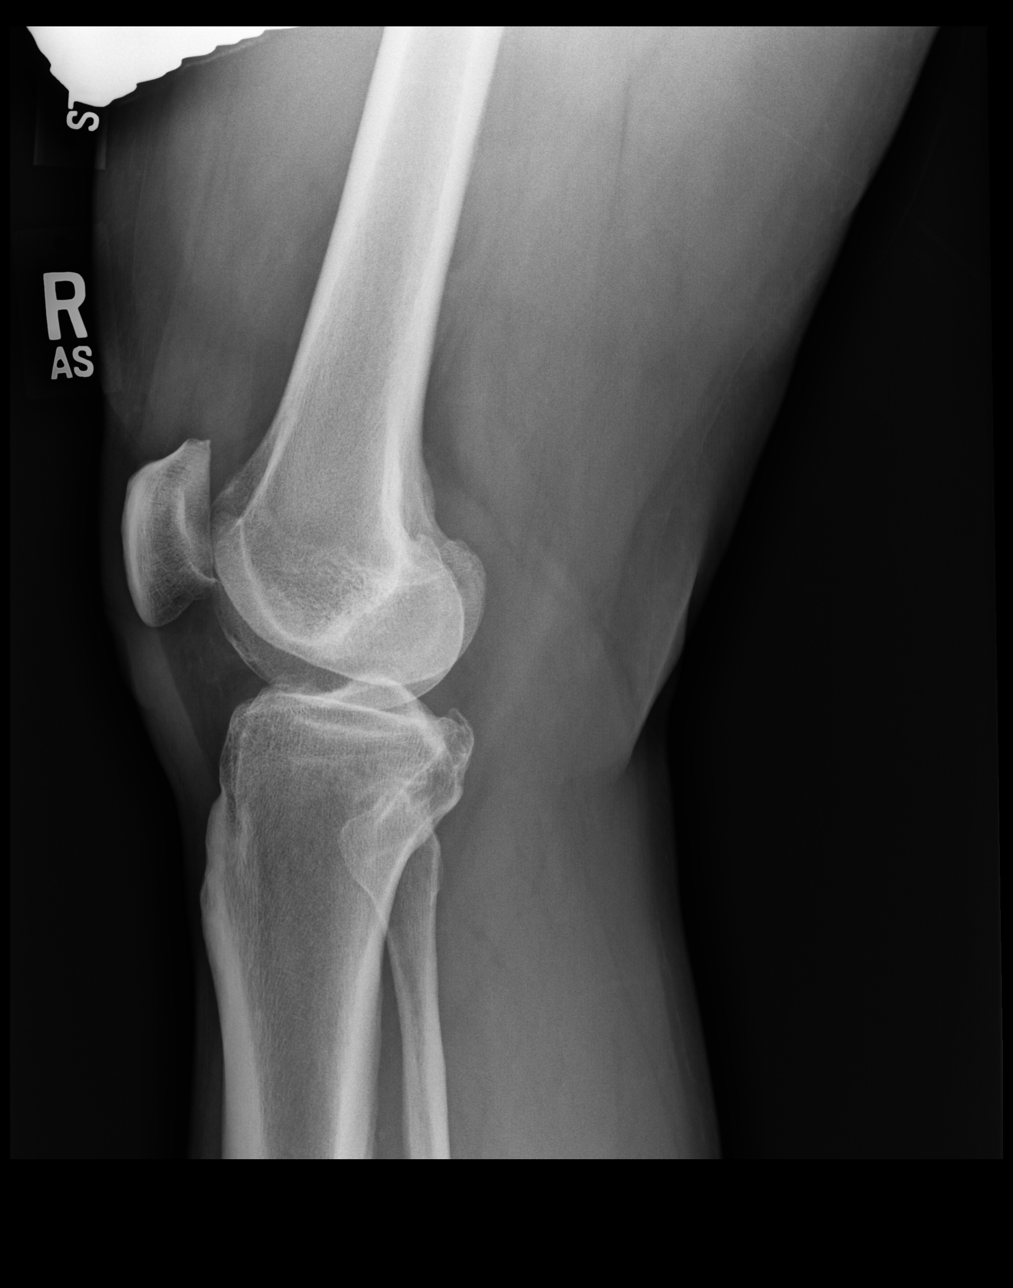

[dg knee complete 4 views right (3 of 4)]
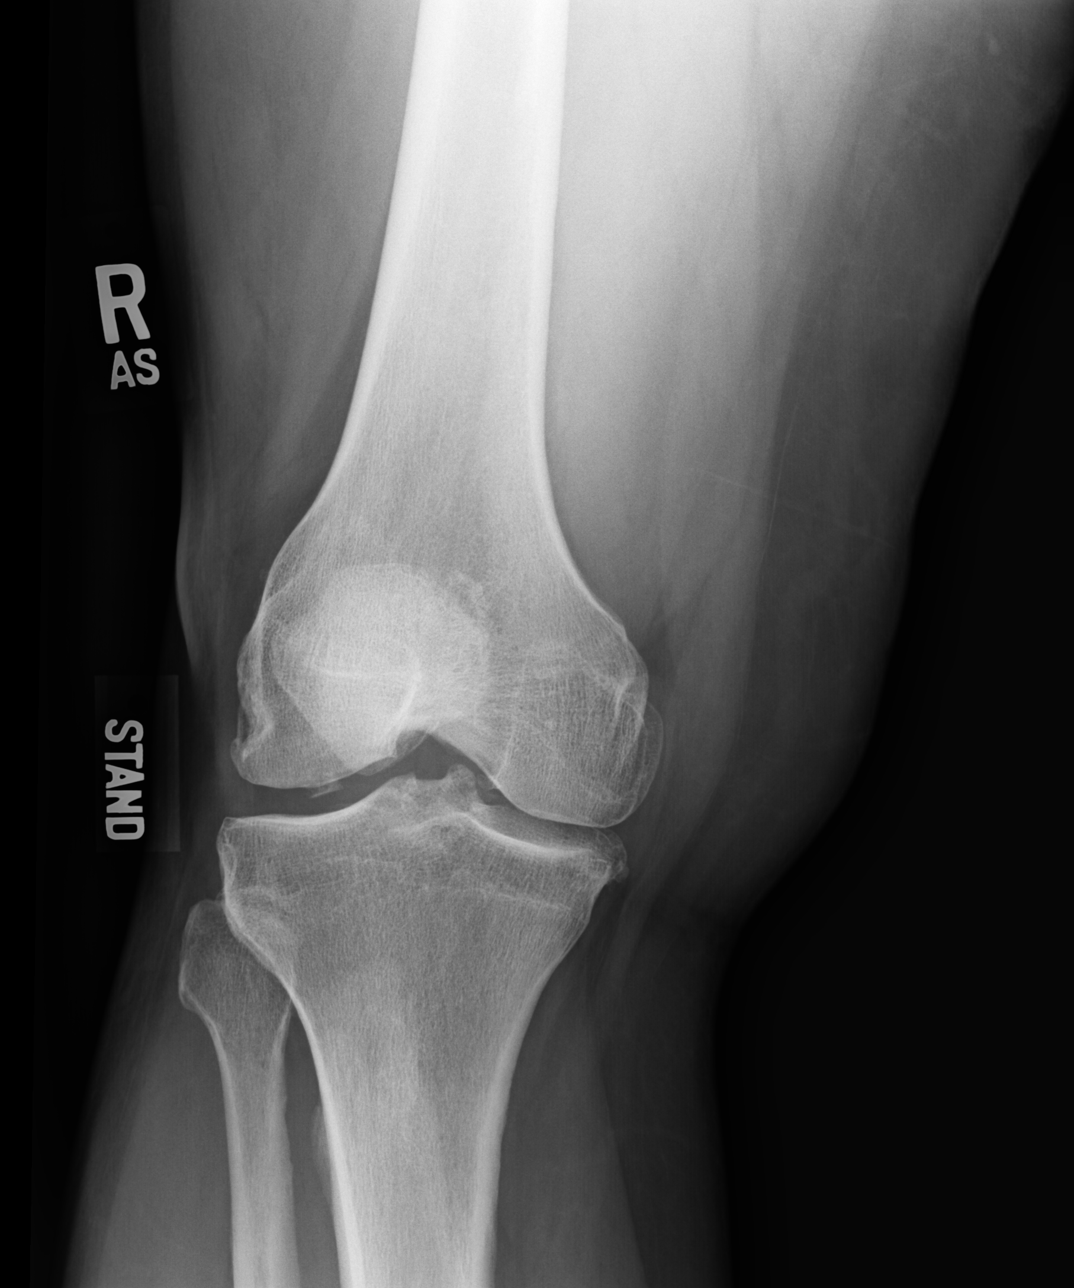

[dg knee complete 4 views right (4 of 4)]
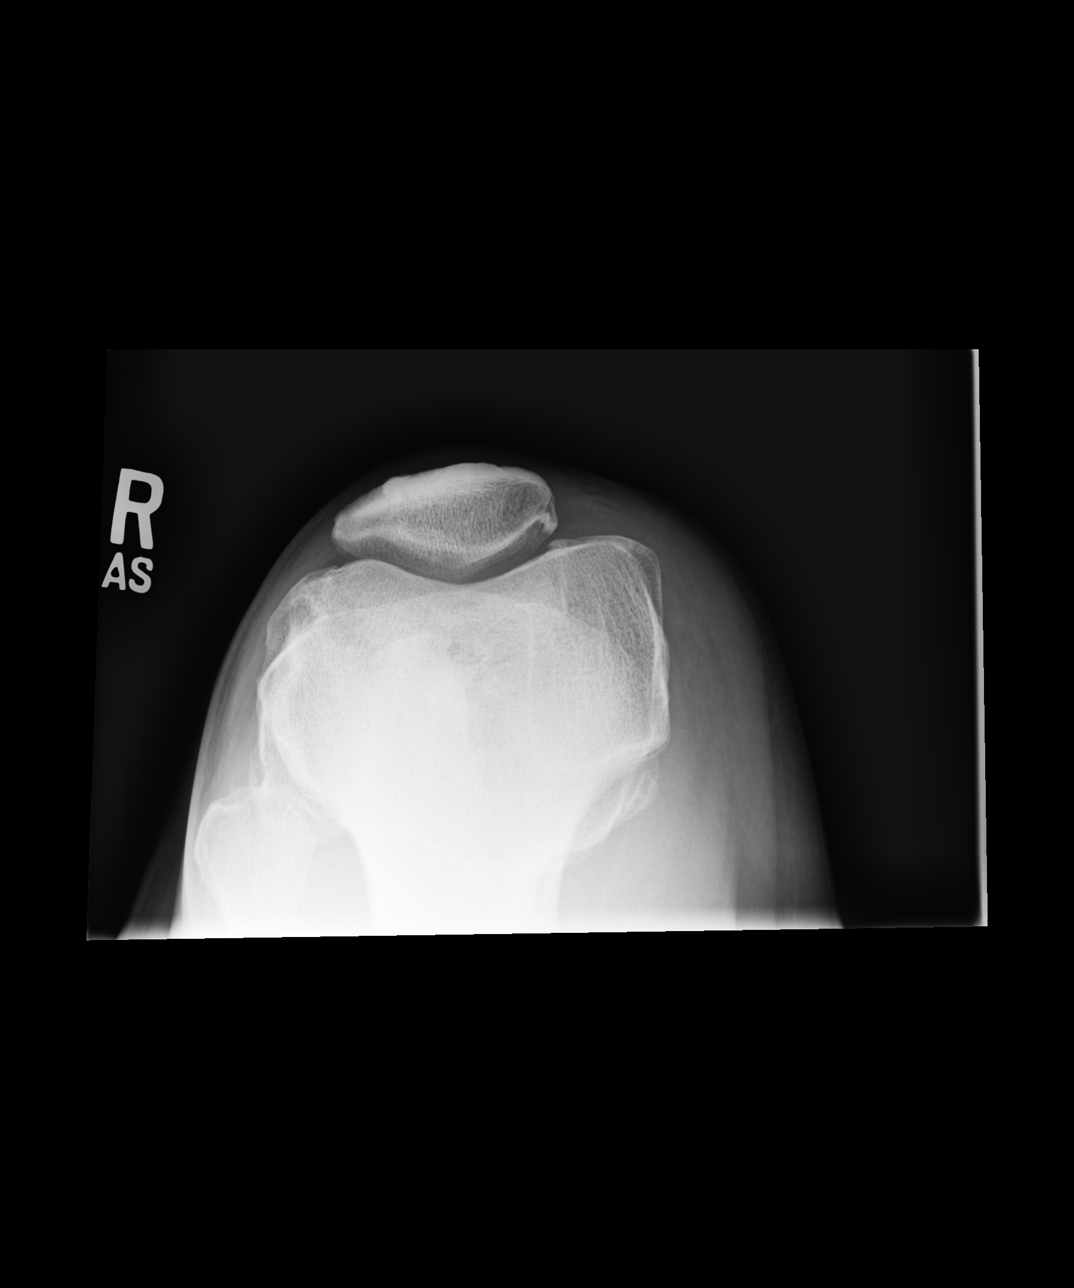

[4 of 4 positions shown; findings below may reference images not displayed]

FINDINGS: No acute fracture. There is a small bone density just inferior to
the weight-bearing surface of the lateral femoral condyle. This
suggests an osteochondral lesion.

There is mild medial joint space compartment narrowing. Small
marginal osteophytes are noted from all 3 compartments.

There is a small joint effusion.

The soft tissues are unremarkable.
IMPRESSION: 1. No acute fracture or dislocation.
2. Probable osteochondral lesion along the weight-bearing surface of
the lateral femoral condyle.
3. Mild osteoarthritis.
4. Small joint effusion.

## 2018-10-01 ENCOUNTER — Other Ambulatory Visit: Payer: Self-pay

## 2018-10-01 DIAGNOSIS — B009 Herpesviral infection, unspecified: Secondary | ICD-10-CM

## 2018-10-01 MED ORDER — VALACYCLOVIR HCL 500 MG PO TABS: 500.0000 mg | ORAL_TABLET | Freq: Every day | ORAL | 3 refills | Status: DC

## 2018-10-17 ENCOUNTER — Other Ambulatory Visit: Payer: Self-pay | Admitting: Family Medicine

## 2018-10-17 DIAGNOSIS — E119 Type 2 diabetes mellitus without complications: Secondary | ICD-10-CM

## 2019-01-18 ENCOUNTER — Encounter: Payer: Self-pay | Admitting: Family Medicine

## 2019-01-24 ENCOUNTER — Telehealth: Payer: Self-pay | Admitting: *Deleted

## 2019-01-24 NOTE — Telephone Encounter (Signed)
Scheduled

## 2019-01-24 NOTE — Telephone Encounter (Signed)
LMOVM for pt to call back and make an appt for his nails and blood pressure. Deseree Kennon Holter, CMA

## 2019-01-24 NOTE — Telephone Encounter (Signed)
-----   Message from Martyn Malay, MD sent at 01/23/2019  1:59 PM EDT ----- Regarding: Appointment Please schedule with Dr. Rosita Fire for a check in on nails and blood pressure.   Thanks!

## 2019-02-04 NOTE — Patient Instructions (Addendum)
It was a pleasure to see you today! Thank you for choosing [facility] for your primary care. Francisco Griffin was seen for left knee concerns, blood pressure concerns and toenail changes.   Our plans for today were:  Please monitor your blood pressure at home for the next month and record readings in a journal.  We can schedule a virtual visit to follow-up about your blood pressure at that time.  Toenail fungus:  I am referring you for a podiatry visit for further evaluation.   Left knee concern: Please continue to do your normal exercises, stretches, as well as the exercises supplied in the handout.    Please continue to do the things that help ease this discomfort in your knee.    I have also ordered a left knee x-ray at [facility] imaging.  You do not need an appointment in order to have this x-ray, just walking and let them know you are there for an x-ray.    This referral has been sent electronically.    Please let me know if you would be interested in physical therapy in the future.  To keep you healthy, we need to monitor some screening tests. You were due for:   -Diabetes Foot exam (this was completed today) -Ophthalmology Exam (please have done as soon as possible to check for any changes to your retina) -Influenza Vaccine (this is been completed at [facility], great job!) -Hemoglobin A1c (we will re-check this today)   You should return to our clinic in 6 months for follow-up of your diabetes.   Podiatry and Sports Medicine will call you for an appointment.  We can schedule you for a virtual visit to follow-up on your blood pressure.  Best Wishes,  Dr. Rosita Fire

## 2019-02-04 NOTE — Progress Notes (Addendum)
Patient Name: Francisco Griffin Meikle Date of Birth: 1961/01/11 Date of Visit: 02/05/19 PCP: Nicki GuadalajaraSimmons, Neka Bise, MD  Chief Complaint:   Subjective: Francisco Griffin Frampton is a [age] y.o. with medical history significant for GERD, HLD, diabetes type 2, osteoarthritis, HSV-2, OSA(CPAP) presenting today for knee pain and blood pressure.   Left knee discomfort:   Francisco Griffin Tull states his left knee has been having popping and "joint catching" sensations for 30 days.  He also goes on to state that his left knee joint seems to "catch" with extension of his leg.  Patient reports being active and that the discomfort improves with more movement throughout the day.  He reports that his joints feel tight in the mornings and improves throughout the day.  Patient states that he is not in an overt amount of pain and rates the severity as 3-4/10 at its worse.  Patient states that his functionality is not impacted by this new discomfort.  Patient states that ibuprofen and Aleve do not change his knee discomfort.  Blood Pressure:  Patient reports that he has had his blood pressure measure 140/90 at the dentist office and during a health fair.  Patient reports that he does not measure his blood pressure at home.  Patient's blood pressure today is 126/75.  Toenail fungus?: Patient states that he has noticed the change in the color of his toenails.  Patient states that his second and fifth toenails bilaterally are dark yellow in color.  He denies any pruritus or pain associated with his toenails.  Patient also denies any numbness, tingling in his feet nor hands, patient has history of well-controlled diabetes for which he takes metformin 850 mg and is due for diabetic foot exam.  ROS: Per HPI  I have reviewed the patient's medical, surgical, family, and social history as appropriate.  There were no vitals filed for this visit.  Physical Exam:   General: Alert and cooperative and appears to be in no acute distress Cardio: Normal S1 and  S2, no S3 or S4. Rhythm is regular. No murmurs or rubs appreciated  Pulm: Clear to auscultation bilaterally, no crackles, wheezing, or diminished breath sounds. Normal respiratory effort Abdomen: Bowel sounds normal. Abdomen soft and non-tender.  Extremities: No peripheral edema. Warm/ well perfused.  Strong radial and pedal pulses. Knee Joints: No erythema, no edema, left knee with healing abrasion, no fluid wave, no medial or lateral meniscal laxity, negative Lachman test, negative anterior drawer test, negative posterior drawer test, no tenderness to palpation of the joint line Neuro: Cranial nerves grossly intact Nails: thickened seconda and 5th toe nails bilaterally, with nail splitting from recent attempt to trim nails, yellow tented color  Assessment & Plan:   Controlled type 2 diabetes mellitus without complication, without long-term current use of insulin (HCC) Hemoglobin A1c 6.2 today. -Patient to continue metformin 850 mg -Diabetic foot exam completed with no abnormalities  Infection of toenail Acute changes in toenail including this colorization and thickening.  Patient denies pruritus or pain associated with toenail.  Patient counseled that we typically do not start treatment for toenail fungus without definitive diagnosis so will refer to podiatry.  Patient agreeable with this plan. -Referral to podiatry   Left knee pain Patient presents with 30 days of left knee discomfort associated with catching of the joint that worsens with extension, no alleviation with ibuprofen or Aleve, joints are tight in the morning and improves throughout the day with movement.  Patient has history of right knee osteoarthritis confirmed with MRI. -Referral  to sports medicine clinic -Handout for knee exercises given -Left knee x-ray   Routine adult health maintenance Patient presents with concern for elevated blood pressure measurements of 140/90 at the dentist and health fair.  Patient agreeable  to recording blood pressure measurements at home.  Patient currently on lisinopril 2.5 mg.  Blood pressure well controlled, measuring 126/75 in office. -Patient to complete blood pressure measurement journal -Patient will have virtual follow-up for blood pressure -Continue lisinopril 2.5 mg daily -CMP collected  Hyperlipidemia due to type 2 diabetes mellitus ([place]) Lipid panel collected today   Return to care as scheduled on October 15th virtual visit to follow-up on blood pressure.  Stark Klein, MD  Family Medicine  PGY1

## 2019-02-05 ENCOUNTER — Encounter: Payer: Self-pay | Admitting: Family Medicine

## 2019-02-05 ENCOUNTER — Ambulatory Visit: Payer: BC Managed Care – PPO | Admitting: Family Medicine

## 2019-02-05 ENCOUNTER — Other Ambulatory Visit: Payer: Self-pay

## 2019-02-05 VITALS — BP 126/75 | HR 70 | Wt 244.6 lb

## 2019-02-05 DIAGNOSIS — Z0000 Encounter for general adult medical examination without abnormal findings: Secondary | ICD-10-CM

## 2019-02-05 DIAGNOSIS — E1169 Type 2 diabetes mellitus with other specified complication: Secondary | ICD-10-CM

## 2019-02-05 DIAGNOSIS — E785 Hyperlipidemia, unspecified: Secondary | ICD-10-CM

## 2019-02-05 DIAGNOSIS — E119 Type 2 diabetes mellitus without complications: Secondary | ICD-10-CM

## 2019-02-05 DIAGNOSIS — L03039 Cellulitis of unspecified toe: Secondary | ICD-10-CM | POA: Insufficient documentation

## 2019-02-05 DIAGNOSIS — M25562 Pain in left knee: Secondary | ICD-10-CM

## 2019-02-05 DIAGNOSIS — G8929 Other chronic pain: Secondary | ICD-10-CM

## 2019-02-05 DIAGNOSIS — B351 Tinea unguium: Secondary | ICD-10-CM

## 2019-02-05 LAB — POCT GLYCOSYLATED HEMOGLOBIN (HGB A1C): HbA1c, POC (controlled diabetic range): 6.2 % (ref 0.0–7.0)

## 2019-02-05 MED ORDER — METFORMIN HCL 850 MG PO TABS: 850.0000 mg | ORAL_TABLET | Freq: Two times a day (BID) | ORAL | 3 refills | Status: DC

## 2019-02-05 NOTE — Assessment & Plan Note (Signed)
Lipid panel collected today. 

## 2019-02-05 NOTE — Assessment & Plan Note (Signed)
Hemoglobin A1c 6.2 today. -Patient to continue metformin 850 mg -Diabetic foot exam completed with no abnormalities

## 2019-02-05 NOTE — Assessment & Plan Note (Addendum)
Patient presents with concern for elevated blood pressure measurements of 140/90 at the dentist and health fair.  Patient agreeable to recording blood pressure measurements at home.  Patient currently on lisinopril 2.5 mg.  Blood pressure well controlled, measuring 126/75 in office. -Patient to complete blood pressure measurement journal -Patient will have virtual follow-up for blood pressure -Continue lisinopril 2.5 mg daily -CMP collected

## 2019-02-05 NOTE — Assessment & Plan Note (Addendum)
Patient presents with 30 days of left knee discomfort associated with catching of the joint that worsens with extension, no alleviation with ibuprofen or Aleve, joints are tight in the morning and improves throughout the day with movement.  Patient has history of right knee osteoarthritis confirmed with MRI. -Referral to sports medicine clinic -Handout for knee exercises given -Left knee x-ray

## 2019-02-05 NOTE — Assessment & Plan Note (Signed)
Acute changes in toenail including this colorization and thickening.  Patient denies pruritus or pain associated with toenail.  Patient counseled that we typically do not start treatment for toenail fungus without definitive diagnosis so will refer to podiatry.  Patient agreeable with this plan. -Referral to podiatry

## 2019-02-06 LAB — COMPREHENSIVE METABOLIC PANEL
ALT: 22 IU/L (ref 0–44)
AST: 20 IU/L (ref 0–40)
Albumin/Globulin Ratio: 1.6 (ref 1.2–2.2)
Albumin: 4.3 g/dL (ref 3.8–4.9)
Alkaline Phosphatase: 45 IU/L (ref 39–117)
BUN/Creatinine Ratio: 18 (ref 9–20)
BUN: 18 mg/dL (ref 6–24)
Bilirubin Total: 0.5 mg/dL (ref 0.0–1.2)
CO2: 25 mmol/L (ref 20–29)
Calcium: 9.6 mg/dL (ref 8.7–10.2)
Chloride: 101 mmol/L (ref 96–106)
Creatinine, Ser: 0.99 mg/dL (ref 0.76–1.27)
GFR calc Af Amer: 97 mL/min/{1.73_m2} (ref >59)
GFR calc non Af Amer: 84 mL/min/{1.73_m2} (ref >59)
Globulin, Total: 2.7 g/dL (ref 1.5–4.5)
Glucose: 108 mg/dL — ABNORMAL HIGH (ref 65–99)
Potassium: 4.6 mmol/L (ref 3.5–5.2)
Sodium: 140 mmol/L (ref 134–144)
Total Protein: 7 g/dL (ref 6.0–8.5)

## 2019-02-06 LAB — LIPID PANEL
Chol/HDL Ratio: 3.2 ratio (ref 0.0–5.0)
Cholesterol, Total: 149 mg/dL (ref 100–199)
HDL: 47 mg/dL (ref >39)
LDL Chol Calc (NIH): 79 mg/dL (ref 0–99)
Triglycerides: 132 mg/dL (ref 0–149)
VLDL Cholesterol Cal: 23 mg/dL (ref 5–40)

## 2019-02-06 NOTE — Progress Notes (Signed)
Called and spoke with patient. Patient notified of results and inquired if these would be available in [place]. Patient informed that these results would be available.

## 2019-02-10 ENCOUNTER — Ambulatory Visit: Payer: BC Managed Care – PPO | Admitting: Family Medicine

## 2019-02-10 ENCOUNTER — Other Ambulatory Visit: Payer: Self-pay

## 2019-02-10 VITALS — BP 132/72 | Ht 72.0 in | Wt 235.0 lb

## 2019-02-10 DIAGNOSIS — M222X2 Patellofemoral disorders, left knee: Secondary | ICD-10-CM | POA: Diagnosis not present

## 2019-02-10 NOTE — Patient Instructions (Signed)
This is consistent with a small medial plica from the kneecap not tracking properly or arthritis behind the patella.  Start the straight leg raises and straight leg raises with foot turned outwards - 3 sets of 10 once a day. Add ankle weights if these become too easy. Can consider physical therapy if this doesn't improve. Consider getting the x-rays ordered by your family physician to see the degree of arthritis. Follow up with me as needed.

## 2019-02-10 NOTE — Progress Notes (Signed)
   Subjective:    Patient ID: Francisco Griffin, male    DOB: [birth year], 59 y.o.   MRN: [id number]  HPI 58 year old male who presents with left knee discomfort and popping.  He states that he first noticed this about 4 to 5 months ago.  He does not think he had any trauma that preceded this presentation.  He thinks that maybe the popping sensation has been more notable recently.  He does not really feel it more when using stairs or popping his knee.  He says it is not painful and rates the pain a 0 out of 10.  He was recently seen at his PCPs office, left knee x-rays were ordered at that time and the patient has not gotten them yet.  Of note patient has moderate osteoarthritis of the right knee based off MRI from 2018.  Review of Systems Per HPI  [place], medications and smoking status reviewed.    Objective:   Physical Exam General: Well-appearing, pleasant 58 year old Caucasian male Pulmonary: No accessory muscle use, no respiratory distress, able speak in clear coherent sentences Cardio: Skin warm and dry  Left knee Inspection: No focal asymmetry between left and right knee.  No effusion, ecchymoses. Palpation: No tissue swelling appreciated on palpation.  Notable single "pop" in anteromedial knee with knee extension.  With medial manipulation of the patella the popping is much less pronounced. Range of motion: Fully intact to knee extension, flexion Strength: Fully intact to knee flexion, extension, hip flexion, hip abduction, hip adduction Neurovascular: Sensation fully intact, skin warm and dry Special test: Negative anterior drawer, negative posterior drawer, negative Thessaly, negative McMurray  Right knee: No deformity, instability. FROM with 5/5 strength. No tenderness to palpation. NVI distally.    Assessment & Plan:  Assessment 58 year old Caucasian male with left knee popping sensation with full extension.  Patient likely with some osteoarthritis given his known  osteoarthritis of the knee.  Given the near complete reduction of the pop sensation and sound with manipulation of patella patient likely has a plica causing his symptoms from patellar malalignment.  Give patient instructions on VMO strengthening exercises to help bring his patella back into alignment.  Patient can get x-rays performed if he would like to evaluate the degree of his osteoarthritis although likely not change his management.  Can take Tylenol as needed for discomfort.  Left knee plica -VMO strengthening exercises to help with patellar realignment -Tylenol as needed for pain -Can get x-rays if he would like to get osteoarthritis evaluated -Follow-up as needed  Guadalupe Dawn MD PGY-3 Family Medicine Resident

## 2019-02-11 ENCOUNTER — Encounter: Payer: Self-pay | Admitting: Family Medicine

## 2019-02-20 ENCOUNTER — Encounter: Payer: Self-pay | Admitting: Podiatry

## 2019-02-20 ENCOUNTER — Other Ambulatory Visit: Payer: Self-pay

## 2019-02-20 ENCOUNTER — Ambulatory Visit (INDEPENDENT_AMBULATORY_CARE_PROVIDER_SITE_OTHER): Payer: BC Managed Care – PPO | Admitting: Podiatry

## 2019-02-20 ENCOUNTER — Ambulatory Visit (INDEPENDENT_AMBULATORY_CARE_PROVIDER_SITE_OTHER): Payer: BC Managed Care – PPO

## 2019-02-20 VITALS — BP 128/66 | HR 74 | Resp 16

## 2019-02-20 DIAGNOSIS — M2011 Hallux valgus (acquired), right foot: Secondary | ICD-10-CM

## 2019-02-20 DIAGNOSIS — M2012 Hallux valgus (acquired), left foot: Secondary | ICD-10-CM

## 2019-02-20 DIAGNOSIS — L603 Nail dystrophy: Secondary | ICD-10-CM | POA: Diagnosis not present

## 2019-02-20 NOTE — Progress Notes (Signed)
Subjective:  Patient ID: Francisco Griffin, male    DOB: May 15, 1961,  MRN: [id number] HPI Chief Complaint  Patient presents with  . Nail Problem    2nd toe left - thick, dark nail x years, tried multiple OTC/home remedies-no help  . 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 Diabetes    Last a1c was 6.2  . New Patient (Initial Visit)    58 y.o. male presents with the above complaint.   ROS: Denies fever chills nausea vomiting muscle aches pains calf pain back pain chest pain shortness of breath.  Past Medical History:  Diagnosis Date  . Allergy   . Anxiety   . Diabetes mellitus without complication [facility])    Past Surgical History:  Procedure Laterality Date  . FRACTURE SURGERY     5th grade broke wrist    Current Outpatient Medications:  .  aspirin EC 81 MG tablet, Take 81 mg by mouth daily., Disp: , Rfl:  .  Chromium-Cinnamon (CINNAMON PLUS CHROMIUM) 100-500 MCG-MG CAPS, Take 1 capsule by mouth daily., Disp: , Rfl:  .  FEXOFENADINE HCL PO, Take 180 mg by mouth daily as needed., Disp: , Rfl:  .  lisinopril (ZESTRIL) 2.5 MG tablet, TAKE 1 TABLET BY MOUTH EVERY DAY, Disp: 90 tablet, Rfl: 3 .  metFORMIN (GLUCOPHAGE) 850 MG tablet, Take 1 tablet (850 mg total) by mouth 2 (two) times daily with a meal., Disp: 180 tablet, Rfl: 3 .  Multiple Vitamins-Minerals (CENTRUM SILVER PO), Take 1 capsule by mouth daily., Disp: , Rfl:  .  rosuvastatin (CRESTOR) 10 MG tablet, Take 1 tablet (10 mg total) by mouth daily., Disp: 90 tablet, Rfl: 3 .  valACYclovir (VALTREX) 500 MG tablet, Take 1 tablet (500 mg total) by mouth daily., Disp: 90 tablet, Rfl: 3  Allergies  Allergen Reactions  . Lipitor [Atorvastatin] Other (See Comments)    Muscle aches across his chest   Review of Systems Objective:   Vitals:   02/20/19  1335  BP: 128/66  Pulse: 74  Resp: 16    General: Well developed, nourished, in no acute distress, alert and oriented x3   Dermatological: Skin is warm, dry and supple bilateral. Nails x 10 are well maintained; remaining integument appears unremarkable at this time. There are no open sores, no preulcerative lesions, no rash or signs of infection present.  Vascular: Dorsalis Pedis artery and Posterior Tibial artery pedal pulses are 2/4 bilateral with immedate capillary fill time. Pedal hair growth present. No varicosities and no lower extremity edema present bilateral.   Neruologic: Grossly intact via light touch bilateral. Vibratory intact via tuning fork bilateral. Protective threshold with Semmes Wienstein monofilament intact to all pedal sites bilateral. Patellar and Achilles deep tendon reflexes 2+ bilateral. No Babinski or clonus noted bilateral.   Musculoskeletal: No gross boney pedal deformities bilateral. No pain, crepitus, or limitation noted with foot and ankle range of motion bilateral. Muscular strength 5/5 in all groups tested bilateral.  Gait: Unassisted, Nonantalgic.    Radiographs:  Radiographs taken today demonstrate mild metatarsus adductus with increase in the first intermetatarsal angle greater than normal value resulting in a hallux abductus angle greater than normal value large hypertrophic medial condyle to the head of the first metatarsals with a mildly elevated first metatarsal consistent with primus elevatus  Assessment &  Plan:   Assessment: Discussed etiology pathology conservative surgical therapies at this point we discussed nail dystrophy second digit left foot also discussed severe bunion deformities bilateral left greater than right.  Plan: Discussed surgical intervention today in great detail he will follow-up with me next year for surgical consult.     Chrisette Man T. [place], [place]

## 2019-03-13 ENCOUNTER — Telehealth: Payer: BC Managed Care – PPO | Admitting: Family Medicine

## 2019-03-21 ENCOUNTER — Telehealth (INDEPENDENT_AMBULATORY_CARE_PROVIDER_SITE_OTHER): Payer: BC Managed Care – PPO | Admitting: Family Medicine

## 2019-03-21 ENCOUNTER — Other Ambulatory Visit: Payer: Self-pay

## 2019-03-21 ENCOUNTER — Encounter: Payer: Self-pay | Admitting: Family Medicine

## 2019-03-21 DIAGNOSIS — Z0130 Encounter for examination of blood pressure without abnormal findings: Secondary | ICD-10-CM | POA: Diagnosis not present

## 2019-03-21 NOTE — Assessment & Plan Note (Signed)
Patient following up for blood pressure check/Blood pressure journal review to evaluate for elevated BP measurements. Patient reports BP from the lst 6 weeks ranging from 122-133/63-75. -Patient reassured these were normal ranges  -patient encouraged to continue measuring blood pressures to monitor for systolic values >007 consistently and make an appointment if that occurs  -patient to f/u as needed

## 2019-03-21 NOTE — Progress Notes (Signed)
[place] Telemedicine Visit  Patient consented to have virtual visit. Method of visit: Telephone  Encounter participants: Patient: Francisco Griffin - located at his home address.  Provider: Stark Klein - located at [facility]  Others (if applicable): N/A  Chief Complaint: following up on Blood pressure   HPI:  Blood Pressure Check  Francisco Griffin presented at an earlier appointment with concerns for elevated blood pressures on 2 separate visits to his dentist and while at a health fair. Patient reported having blood pressures in the mid 774J systolic. During our last appointment, we agreed that the patient would keep a blood pressure journal for the next 6 weeks and follow up.  Today, Francisco Griffin reports blood pressures ranging from 287-867 systolic with the most recent measurements within the 120s from the past 2 weeks. I applauded Francisco Griffin for normal blood pressures and continuing to regularly exercise and maintain a varied and healthy diet. We also discussed that because his blood pressures were within normal ranges, that he would not require any additional medications nor adjustments to his current doses. Patient is currently on Lisinopril 2.5. Patient was encouraged to follow up as needed.   ROS: Patient denies HA, vision changes, palpitations, chest pain, dizziness and SOB  Pertinent PMHx: well controlled diabetes, polyneuropathy, OSA, osteoarthritis, obesity   Exam:  Respiratory: patient speaking in full sentences with no signs of respiratory distress   Assessment/Plan:  Blood pressure check Patient following up for blood pressure check/Blood pressure journal review to evaluate for elevated BP measurements. Patient reports BP from the lst 6 weeks ranging from 122-133/63-75. -Patient reassured these were normal ranges  -patient encouraged to continue measuring blood pressures to monitor for systolic values >672 consistently and make an  appointment if that occurs  -patient to f/u as needed     Time spent during visit with patient: 15 minutes

## 2019-03-26 LAB — HM DIABETES EYE EXAM

## 2019-03-28 ENCOUNTER — Encounter: Payer: Self-pay | Admitting: Family Medicine

## 2019-08-19 ENCOUNTER — Encounter: Payer: Self-pay | Admitting: Family Medicine

## 2019-09-04 ENCOUNTER — Encounter: Payer: Self-pay | Admitting: Family Medicine

## 2019-09-04 ENCOUNTER — Other Ambulatory Visit: Payer: Self-pay

## 2019-09-04 ENCOUNTER — Ambulatory Visit: Payer: BC Managed Care – PPO | Admitting: Family Medicine

## 2019-09-04 VITALS — BP 128/62 | HR 81 | Wt 243.2 lb

## 2019-09-04 DIAGNOSIS — E119 Type 2 diabetes mellitus without complications: Secondary | ICD-10-CM

## 2019-09-04 DIAGNOSIS — E1169 Type 2 diabetes mellitus with other specified complication: Secondary | ICD-10-CM | POA: Diagnosis not present

## 2019-09-04 DIAGNOSIS — N4889 Other specified disorders of penis: Secondary | ICD-10-CM

## 2019-09-04 DIAGNOSIS — E785 Hyperlipidemia, unspecified: Secondary | ICD-10-CM | POA: Diagnosis not present

## 2019-09-04 LAB — POCT GLYCOSYLATED HEMOGLOBIN (HGB A1C): HbA1c, POC (controlled diabetic range): 6.3 % (ref 0.0–7.0)

## 2019-09-04 NOTE — Assessment & Plan Note (Addendum)
-  Hemoglobin A1c POCT -continue Metformin

## 2019-09-04 NOTE — Progress Notes (Signed)
    SUBJECTIVE:   CHIEF COMPLAINT / HPI: hemoglobin A1c follow up, nodule near genital area   Genital swelling  Patient reports that following his coivd vaccine, his wife noticed a lump at the base of his penis that has been present for close to 1 month. He is concerned that this may be a side effect of the vaccine given the change in lymph nodes axillary lymph nodes that he read occurs in women. Patient states that this enlargement is not painful and has not impacted his ability to urinate or function sexually. There are no changes to the surrounding skin.   DM,Type 2  Patient continues to have physical activity but not as much as is normal for him. He does not have medication. He has been tolerating his metformin well. Patient states it has been a while since he last knew his hemoglobin A1c and received a notification that he was due for a test.   HLD  Last lipid panel was within normal limits. Patient reports that he has been taking  1/2 of his 10mg  statin medication with lipid results that remain within goal range. He expresses some concern that he is trying to avoid a reaction to the statin as he had a negative reaction to a statin before. He denies muscle weakness or soreness since being on this medication and would like to know if he should take the medication at a smaller dose or take the whole 10mg  that he has at home.    PERTINENT  PMH / PSH: DM, HLD  OBJECTIVE:   BP 128/62   Pulse 81   Wt 243 lb 3.2 oz (110.3 kg)   SpO2 97%   BMI 32.98 kg/m    Physical Exam  General: Alert and cooperative and appears to be in no acute distress HEENT: Neck non-tender without lymphadenopathy, masses or thyromegaly Cardio: Normal S1 and S2, no S3 or S4. Rhythm is regular. No murmurs or rubs.   Pulm: Clear to auscultation bilaterally, no crackles, wheezing, or diminished breath sounds. Normal respiratory effort GU:  questionable swelling at  base of penis on the right side, no skin changes, no  erythema, no penile discharge, circumcised glans normal in appearance without ulceration or lesion, non mobile, do not appreciate inguinal hernia, located in distribution of corpus callosum, nontender to palpation    Abdomen: Bowel sounds normal. Abdomen soft and non-tender.  Extremities: No peripheral edema. Warm/ well perfused.  Neuro: alert and oriented x4  ASSESSMENT/PLAN:   Controlled type 2 diabetes mellitus without complication, without long-term current use of insulin (HCC) -Hemoglobin A1c POCT -continue Metformin   Penile mass Differential for penile mass could be a normal anatomical structure such as corpus callosum, benign or malignant penile tumor or  enlarged lymph node -urology referral   Hyperlipidemia due to type 2 diabetes mellitus (HCC) HLD well controlled on rosuvastatin 10mg  daily  -encourage patient to take 10mg  pills until HLD results are available  -as long as LDL within goal range, can reconsider lower dosing  -lipid panel    Patient to follow up as needed    , MD [facility]

## 2019-09-04 NOTE — Patient Instructions (Addendum)
It was a pleasure to see you today! Thank you for choosing [facility] for your primary care. Francisco Griffin was seen for swelling near genitals and hemoglobin a1c follow up.   Our plans for today were:  Base penis swelling: I will refer you to urology for further evaluation.   We will also check your kidney and liver enzymes as well as electrolytes.   Once we know your lipid panel, we can change your cholesterol medicine as needed. Please continue to try the 10mg  in the meantime.   To keep you healthy, we need to monitor some screening tests.   You are due for :  1. Hemoglobin A1c -6.3, within normal range.  You should return to our clinic to see  Best,  Dr. 

## 2019-09-05 ENCOUNTER — Other Ambulatory Visit: Payer: Self-pay

## 2019-09-05 ENCOUNTER — Other Ambulatory Visit: Payer: BC Managed Care – PPO

## 2019-09-05 DIAGNOSIS — E785 Hyperlipidemia, unspecified: Secondary | ICD-10-CM

## 2019-09-05 DIAGNOSIS — E119 Type 2 diabetes mellitus without complications: Secondary | ICD-10-CM

## 2019-09-06 LAB — LIPID PANEL
Chol/HDL Ratio: 3.8 ratio (ref 0.0–5.0)
Cholesterol, Total: 174 mg/dL (ref 100–199)
HDL: 46 mg/dL (ref >39)
LDL Chol Calc (NIH): 101 mg/dL — ABNORMAL HIGH (ref 0–99)
Triglycerides: 151 mg/dL — ABNORMAL HIGH (ref 0–149)
VLDL Cholesterol Cal: 27 mg/dL (ref 5–40)

## 2019-09-06 LAB — COMPREHENSIVE METABOLIC PANEL
ALT: 23 IU/L (ref 0–44)
AST: 20 IU/L (ref 0–40)
Albumin/Globulin Ratio: 1.7 (ref 1.2–2.2)
Albumin: 4.6 g/dL (ref 3.8–4.9)
Alkaline Phosphatase: 49 IU/L (ref 39–117)
BUN/Creatinine Ratio: 15 (ref 9–20)
BUN: 15 mg/dL (ref 6–24)
Bilirubin Total: 0.5 mg/dL (ref 0.0–1.2)
CO2: 24 mmol/L (ref 20–29)
Calcium: 9.7 mg/dL (ref 8.7–10.2)
Chloride: 103 mmol/L (ref 96–106)
Creatinine, Ser: 0.97 mg/dL (ref 0.76–1.27)
GFR calc Af Amer: 98 mL/min/{1.73_m2} (ref >59)
GFR calc non Af Amer: 85 mL/min/{1.73_m2} (ref >59)
Globulin, Total: 2.7 g/dL (ref 1.5–4.5)
Glucose: 136 mg/dL — ABNORMAL HIGH (ref 65–99)
Potassium: 4.4 mmol/L (ref 3.5–5.2)
Sodium: 140 mmol/L (ref 134–144)
Total Protein: 7.3 g/dL (ref 6.0–8.5)

## 2019-09-07 DIAGNOSIS — N4889 Other specified disorders of penis: Secondary | ICD-10-CM | POA: Insufficient documentation

## 2019-09-07 NOTE — Assessment & Plan Note (Signed)
Differential for penile mass could be a normal anatomical structure such as corpus callosum, benign or malignant penile tumor or  enlarged lymph node -urology referral

## 2019-09-07 NOTE — Assessment & Plan Note (Addendum)
HLD well controlled on rosuvastatin 10mg  daily  -encourage patient to take 10mg  pills until HLD results are available  -as long as LDL within goal range, can reconsider lower dosing  -lipid panel

## 2019-09-10 ENCOUNTER — Encounter: Payer: Self-pay | Admitting: Family Medicine

## 2019-09-21 ENCOUNTER — Encounter: Payer: Self-pay | Admitting: Family Medicine

## 2019-09-22 ENCOUNTER — Other Ambulatory Visit: Payer: Self-pay | Admitting: Family Medicine

## 2019-09-22 MED ORDER — ROSUVASTATIN CALCIUM 5 MG PO TABS: 5.0000 mg | ORAL_TABLET | Freq: Every day | ORAL | 3 refills | Status: DC

## 2019-09-22 NOTE — Progress Notes (Signed)
Patient requests rosuvastatin 5mg  to replace 10mg . Last LDL 101 as of 09/05/19. Will plan to recheck lipid panel in 6 months. Patient previously encouraged to continue 10mg  dose but prefers 5mg . RX sent to pharmacy.   , MD  PGY-1, [facility]  FPTS Intern Pager [phone]

## 2019-10-06 ENCOUNTER — Other Ambulatory Visit: Payer: Self-pay

## 2019-10-06 DIAGNOSIS — B009 Herpesviral infection, unspecified: Secondary | ICD-10-CM

## 2019-10-06 MED ORDER — VALACYCLOVIR HCL 500 MG PO TABS: 500.0000 mg | ORAL_TABLET | Freq: Every day | ORAL | 3 refills | Status: DC

## 2019-10-28 ENCOUNTER — Other Ambulatory Visit: Payer: Self-pay | Admitting: Family Medicine

## 2019-10-28 DIAGNOSIS — E119 Type 2 diabetes mellitus without complications: Secondary | ICD-10-CM

## 2019-12-23 ENCOUNTER — Other Ambulatory Visit: Payer: Self-pay | Admitting: Family Medicine

## 2019-12-23 DIAGNOSIS — E119 Type 2 diabetes mellitus without complications: Secondary | ICD-10-CM

## 2020-09-21 ENCOUNTER — Other Ambulatory Visit: Payer: Self-pay | Admitting: Family Medicine

## 2020-09-21 DIAGNOSIS — B009 Herpesviral infection, unspecified: Secondary | ICD-10-CM

## 2020-10-11 ENCOUNTER — Encounter: Payer: Self-pay | Admitting: Family Medicine

## 2020-10-23 ENCOUNTER — Other Ambulatory Visit: Payer: Self-pay | Admitting: Family Medicine

## 2020-10-23 DIAGNOSIS — E119 Type 2 diabetes mellitus without complications: Secondary | ICD-10-CM

## 2020-10-26 NOTE — Progress Notes (Signed)
    SUBJECTIVE:   CHIEF COMPLAINT / HPI: toe nail fungus   Toe nail fungus Patient reports that he has been treating his toe nail fungus with topical therapy for a little over four weeks. He states that he has used topical therapy before with good results. He denies pain with his toes.   DM  Needs hgb A1c checked, DM eye exam and DM foot exam which he is agreeable to today. Patient has been adhering to regimen of metformin and lisinopril. Does not check BG at home.   HM  Patient would like to see pulmonary for his OSA and CPAP usage. He would also like to set up an appointment for colonoscopy. Due for repeat hgb A1c as mentioned above.   PERTINENT  PMH / PSH:  DM  HLD   OBJECTIVE:   BP 124/75   Pulse 76   Ht 6' (1.829 m)   Wt 246 lb 3.2 oz (111.7 kg)   SpO2 99%   BMI 33.39 kg/m   General: male appearing stated age in no acute distress Cardio: Normal S1 and S2, no S3 or S4. Rhythm is regular Pulm: Clear to auscultation bilaterally, no crackles, wheezing, or diminished breath sounds. Normal respiratory effort Abdomen: Bowel sounds normal. Abdomen soft and non-tender.   Foot: right great toe nail with discoloration consistent with fungal infection, thickened medial edge of toe nail, no sign of erythema at base of toe nail   ASSESSMENT/PLAN:   Obstructive sleep apnea Patient reports he has not seen pulm in 2-3 years and would like a referral to address his OSA and CPAP usage.  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

## 2020-10-27 ENCOUNTER — Other Ambulatory Visit: Payer: Self-pay

## 2020-10-27 ENCOUNTER — Encounter: Payer: Self-pay | Admitting: Family Medicine

## 2020-10-27 ENCOUNTER — Ambulatory Visit (INDEPENDENT_AMBULATORY_CARE_PROVIDER_SITE_OTHER): Payer: BC Managed Care – PPO | Admitting: Family Medicine

## 2020-10-27 VITALS — BP 124/75 | HR 76 | Ht 72.0 in | Wt 246.2 lb

## 2020-10-27 DIAGNOSIS — G4733 Obstructive sleep apnea (adult) (pediatric): Secondary | ICD-10-CM | POA: Diagnosis not present

## 2020-10-27 DIAGNOSIS — L03039 Cellulitis of unspecified toe: Secondary | ICD-10-CM

## 2020-10-27 DIAGNOSIS — Z139 Encounter for screening, unspecified: Secondary | ICD-10-CM | POA: Diagnosis not present

## 2020-10-27 DIAGNOSIS — E1169 Type 2 diabetes mellitus with other specified complication: Secondary | ICD-10-CM

## 2020-10-27 DIAGNOSIS — Z0000 Encounter for general adult medical examination without abnormal findings: Secondary | ICD-10-CM

## 2020-10-27 DIAGNOSIS — E119 Type 2 diabetes mellitus without complications: Secondary | ICD-10-CM | POA: Diagnosis not present

## 2020-10-27 DIAGNOSIS — E785 Hyperlipidemia, unspecified: Secondary | ICD-10-CM

## 2020-10-27 LAB — POCT GLYCOSYLATED HEMOGLOBIN (HGB A1C): HbA1c, POC (controlled diabetic range): 6.4 % (ref 0.0–7.0)

## 2020-10-27 MED ORDER — METFORMIN HCL 1000 MG PO TABS: 1000.0000 mg | ORAL_TABLET | Freq: Every day | ORAL | 1 refills | Status: DC

## 2020-10-27 NOTE — Patient Instructions (Addendum)
It was a pleasure to see you today!  Thank you for choosing [facility] for your primary care.   Francisco Griffin was seen for diabetes follow up and foot exam for toenail changes.   Our plans for today were:  Please continue to apply the topical medication for your toenail as directed. If you decided in 3-6 months that you would like to change therapies, let me know.   We have updated the dose of your metformin to 1000mg  once daily for your diabetes. Your hemoglobin A1c is still within pre-diabetes range. Keep up the good work on managing your diet!   Please continue your lisinopril as your repeat blood pressure was well within normal range.   I have submitted a referral for GI to have your colonoscopy. Please be on the lookout for a call from them to schedule an appointment.   I have also sent a referral for you to be evaluated for follow up regarding your sleep apnea and CPAP usage.    To keep you healthy, please keep in mind the following health maintenance items that you are due for:   1. Annual diabetes eye exam    You should return to our clinic in 3 months for diabetes and toenail follow up.   Best Wishes,   Dr. 

## 2020-10-28 LAB — LDL CHOLESTEROL, DIRECT: LDL Direct: 93 mg/dL (ref 0–99)

## 2020-10-30 NOTE — Assessment & Plan Note (Signed)
Repeat LDL today  Discussed possibly increasing crestor 5 to 10 pending LDL results  Patient agreeable to plan

## 2020-10-30 NOTE — Assessment & Plan Note (Signed)
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

## 2020-10-30 NOTE — Assessment & Plan Note (Signed)
Patient reports he has not seen pulm in 2-3 years and would like a referral to address his OSA and CPAP usage.  Amb ref to pulmonary placed

## 2020-10-30 NOTE — Assessment & Plan Note (Signed)
A1c completed  GI referral for colonoscopy submitted today

## 2020-10-30 NOTE — Assessment & Plan Note (Signed)
Repeat hgb A1c today Continue metformin, will increase to 1000mg  daily  Repeat in 6 months

## 2020-12-07 ENCOUNTER — Encounter: Payer: Self-pay | Admitting: Gastroenterology

## 2020-12-13 LAB — HM DIABETES EYE EXAM

## 2020-12-14 ENCOUNTER — Ambulatory Visit: Payer: BC Managed Care – PPO | Admitting: Pulmonary Disease

## 2021-01-18 ENCOUNTER — Other Ambulatory Visit: Payer: Self-pay

## 2021-01-18 ENCOUNTER — Encounter: Payer: Self-pay | Admitting: Pulmonary Disease

## 2021-01-18 ENCOUNTER — Ambulatory Visit: Payer: BC Managed Care – PPO | Admitting: Pulmonary Disease

## 2021-01-18 DIAGNOSIS — G4733 Obstructive sleep apnea (adult) (pediatric): Secondary | ICD-10-CM | POA: Diagnosis not present

## 2021-01-18 DIAGNOSIS — E669 Obesity, unspecified: Secondary | ICD-10-CM | POA: Diagnosis not present

## 2021-01-18 NOTE — Assessment & Plan Note (Signed)
CPAP download was reviewed which shows excellent control of events with average pressure 11.6 cm on auto settings 8 to 14 cm, and moderate leak. He is very compliant more than 6 hours every night and CPAP is certainly helped improve his daytime somnolence and fatigue. Will bring [place] a copy of his sleep study He reports pressure over the bridge of nose with his full facemask and I have advised him to trial AirFit F30 fullface mask

## 2021-01-18 NOTE — Assessment & Plan Note (Signed)
Weight loss encouraged, compliance with goal of at least 4-6 hrs every night is the expectation. Advised against medications with sedative side effects Cautioned against driving when sleepy - understanding that sleepiness will vary on a day to day basis  

## 2021-01-18 NOTE — Progress Notes (Signed)
   Subjective:    Patient ID: Francisco Griffin, male    DOB: [birth year], 60 y.o.   MRN: [id number]  HPI   60 year old man for follow-up of OSA & nasal allergies He has been maintained on CPAP since 2013 after his initial study at [facility] He reports seasonal allergies predominant nasal congestion worse in spring and fall.  Chlorpheniramine and nasal decongestants provide relief, Nasonex has not helped. After his last evaluation in 2019 when he got home and we will CPAP machine in auto settings 8 to 14 cm.  He is settled down with full facemask reports good compliance and no problems with mask or pressure He reports that he is resting well with good improvement in his daytime somnolence and fatigue with CPAP   Review of Systems neg for any significant sore throat, dysphagia, itching, sneezing, nasal congestion or excess/ purulent secretions, fever, chills, sweats, unintended wt loss, pleuritic or exertional cp, hempoptysis, orthopnea pnd or change in chronic leg swelling. Also denies presyncope, palpitations, heartburn, abdominal pain, nausea, vomiting, diarrhea or change in bowel or urinary habits, dysuria,hematuria, rash, arthralgias, visual complaints, headache, numbness weakness or ataxia.      Objective:   Physical Exam  Gen. Pleasant, obese, in no distress ENT - no lesions, no post nasal drip Neck: No JVD, no thyromegaly, no carotid bruits Lungs: no use of accessory muscles, no dullness to percussion, decreased without rales or rhonchi  Cardiovascular: Rhythm regular, heart sounds  normal, no murmurs or gallops, no peripheral edema Musculoskeletal: No deformities, no cyanosis or clubbing , no tremors       Assessment & Plan:

## 2021-01-18 NOTE — Patient Instructions (Signed)
CPAP is working well Please get me a copy of your sleep study Trial of airfit F 30 full face mask

## 2021-01-20 ENCOUNTER — Encounter: Payer: Self-pay | Admitting: Family Medicine

## 2021-01-20 NOTE — Telephone Encounter (Signed)
Please see email from pt with attachments    Good morning Sir   I have attached a copy of the sleep study that was conducted on May 19, 2011 in [place]. This is the only time I participated in a sleep study, although it took 2 separate nights for me to relax and sleep so they could see how I slept. They interrupted me during the second night and put me on a CPAP and adjusted it as I slept to determine what worked for me. It has been a big difference in my rest.  The second attachment is the results of my first 80-90 days of using the CPAP after I received one.  Please let me know that you received this and whether or not there is anything further you need from me - Francisco Griffin  Attachments  [facility] Sleep Study.pdf  [facility] Sleep Compliance Results.pdf

## 2021-01-25 NOTE — Telephone Encounter (Signed)
Per Dr Vassie Loll:  Oretha Milch, MD  Christen Butter, CMA Please print a copy of the study and scan        NPSG 04/2011 show severe OSA with AHI 68/hour, lowest desaturation 65%, corrected by CPAP of 11 cm.  Compliance report confirms CPAP requirement of 12 cm on auto CPAP to 8 to 12 cm   This information is helpful  Nothing else required

## 2021-02-03 ENCOUNTER — Other Ambulatory Visit: Payer: Self-pay

## 2021-02-23 ENCOUNTER — Ambulatory Visit (AMBULATORY_SURGERY_CENTER): Payer: BC Managed Care – PPO

## 2021-02-23 ENCOUNTER — Encounter: Payer: Self-pay | Admitting: Gastroenterology

## 2021-02-23 ENCOUNTER — Other Ambulatory Visit: Payer: Self-pay

## 2021-02-23 ENCOUNTER — Encounter: Payer: BC Managed Care – PPO | Admitting: Gastroenterology

## 2021-02-23 VITALS — Ht 72.0 in | Wt 235.0 lb

## 2021-02-23 DIAGNOSIS — Z1211 Encounter for screening for malignant neoplasm of colon: Secondary | ICD-10-CM

## 2021-02-23 MED ORDER — PLENVU 140 G PO SOLR: 1.0000 | ORAL | 0 refills | Status: DC

## 2021-02-23 NOTE — Progress Notes (Signed)

## 2021-02-24 ENCOUNTER — Encounter: Payer: Self-pay | Admitting: Family Medicine

## 2021-03-09 ENCOUNTER — Encounter: Payer: BC Managed Care – PPO | Admitting: Gastroenterology

## 2021-03-17 ENCOUNTER — Other Ambulatory Visit: Payer: Self-pay | Admitting: Family Medicine

## 2021-03-17 DIAGNOSIS — B009 Herpesviral infection, unspecified: Secondary | ICD-10-CM

## 2021-04-20 ENCOUNTER — Other Ambulatory Visit: Payer: Self-pay | Admitting: Family Medicine

## 2021-04-20 DIAGNOSIS — E119 Type 2 diabetes mellitus without complications: Secondary | ICD-10-CM

## 2021-05-27 ENCOUNTER — Other Ambulatory Visit: Payer: Self-pay

## 2021-05-27 ENCOUNTER — Encounter (HOSPITAL_COMMUNITY): Payer: Self-pay | Admitting: *Deleted

## 2021-05-27 ENCOUNTER — Ambulatory Visit (HOSPITAL_COMMUNITY)
Admission: EM | Admit: 2021-05-27 | Discharge: 2021-05-27 | Disposition: A | Discharge status: Home / Self Care | Payer: BC Managed Care – PPO | Attending: Internal Medicine | Admitting: Internal Medicine

## 2021-05-27 DIAGNOSIS — J0111 Acute recurrent frontal sinusitis: Secondary | ICD-10-CM

## 2021-05-27 DIAGNOSIS — J3489 Other specified disorders of nose and nasal sinuses: Secondary | ICD-10-CM

## 2021-05-27 DIAGNOSIS — R0981 Nasal congestion: Secondary | ICD-10-CM

## 2021-05-27 LAB — POCT RAPID STREP A, ED / UC: Streptococcus, Group A Screen (Direct): NEGATIVE

## 2021-05-27 MED ORDER — PREDNISONE 20 MG PO TABS: 20.0000 mg | ORAL_TABLET | Freq: Every day | ORAL | 0 refills | Status: AC

## 2021-05-27 MED ORDER — LEVOCETIRIZINE DIHYDROCHLORIDE 5 MG PO TABS: 5.0000 mg | ORAL_TABLET | Freq: Every evening | ORAL | 0 refills | Status: DC

## 2021-05-27 MED ORDER — AMOXICILLIN-POT CLAVULANATE 500-125 MG PO TABS: 500.0000 mg | ORAL_TABLET | Freq: Two times a day (BID) | ORAL | 0 refills | Status: AC

## 2021-05-27 NOTE — ED Triage Notes (Signed)
Pt re3ports he had a neg home COVID test yesterday but has a sore throat. Pt reports he has a hx of strep.

## 2021-05-27 NOTE — ED Provider Notes (Signed)
[place]    CSN: [id number] Arrival date & time: 05/27/21  7341      History   Chief Complaint Chief Complaint  Patient presents with   Sore Throat   Nasal Congestion    HPI Francisco Griffin is a 60 y.o. male.   Pleasant 60 year old male presents today with an acute onset of a sore throat, nasal congestion, sinus pain.  He states he is prone to sinus infections and usually gets 1 every 1 to 2 years.  He states it started out primarily as a sore throat 3 days ago.  He then noted some nasal congestion and drainage down the back of his throat.  He notes pressure in his ears and sinus pain in his cheeks.  He has been taking over-the-counter medications without resolution of symptoms.  He does has a history of strep throat.  Patient has a history of diabetes for which he takes metformin, his sugars he states have been running around 115.   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 Authorizing Provider  amoxicillin-clavulanate (AUGMENTIN) 500-125 MG tablet Take 1 tablet (500 mg total) by mouth 2 (two) times daily with a meal for 10 days. 05/27/21 06/06/21 Yes Crain, Whitney L, PA  levocetirizine (XYZAL) 5 MG tablet Take 1 tablet (5 mg total) by mouth every evening. 05/27/21  Yes Crain, Whitney L, PA  predniSONE (DELTASONE) 20 MG tablet Take 1 tablet (20 mg total) by mouth daily for 5 days. 05/27/21 06/01/21 Yes Crain, Whitney L, PA  aspirin EC 81 MG tablet Take 81 mg by mouth daily.    [provider]  b complex vitamins capsule Take 1 capsule by mouth daily.    [provider]  Chromium-Cinnamon 100-500 MCG-MG CAPS Take 1 capsule by mouth daily.    [provider]  Ibuprofen (MOTRIN PO) Take by mouth.    [provider]  lisinopril (ZESTRIL) 2.5 MG tablet TAKE 1 TABLET BY MOUTH EVERY DAY 04/20/21   Simmons-Robinson, Makiera, MD  metFORMIN (GLUCOPHAGE) 1000 MG tablet TAKE 1 TABLET BY MOUTH EVERY DAY WITH LUNCH 04/20/21   Simmons-Robinson, Makiera, MD  Naproxen Sodium (ALEVE PO) Take by mouth.    [provider]  PEG-KCl-NaCl-NaSulf-Na Asc-C (PLENVU) 140 g SOLR Take 1 kit  by mouth as directed. 02/23/21   Ladene Artist, MD  rosuvastatin (CRESTOR) 5 MG tablet TAKE 1 TABLET BY MOUTH EVERY DAY 03/17/21   Simmons-Robinson, Riki Sheer, MD  valACYclovir (VALTREX) 500 MG tablet TAKE 1 TABLET BY MOUTH EVERY DAY 03/17/21   Simmons-Robinson, Riki Sheer, MD    Family History Family History  Problem Relation Age of Onset   Cancer Mother    Heart disease Father    Pulmonary fibrosis Father    Hyperlipidemia Father    Hypertension Father    Diabetes Father    Hyperlipidemia Sister    Asthma Sister    Esophageal cancer Maternal Grandfather    Cancer Maternal Grandfather    Heart disease Paternal Grandmother    Diabetes Paternal  Grandfather    Heart disease Paternal Grandfather    Colon cancer Neg Hx    Colon polyps Neg Hx    Stomach cancer Neg Hx    Rectal cancer Neg Hx     Social History Social History   Tobacco Use   Smoking status: Never   Smokeless tobacco: Never  Vaping Use   Vaping Use: Never used  Substance Use Topics   Alcohol use: Yes    Alcohol/week: 1.0 standard drink    Types: 1 Cans of beer per week   Drug use: No     Allergies   Lipitor [atorvastatin]   Review of Systems Review of Systems As per hpi  Physical Exam Triage Vital Signs ED Triage Vitals  Enc Vitals Group     BP 05/27/21 0931 128/72     Pulse Rate 05/27/21 0931 64     Resp 05/27/21 0931 20     Temp 05/27/21 0931 98.4 F (36.9 C)     Temp src --      SpO2 05/27/21 0931 98 %     Weight --      Height --      Head Circumference --      Peak Flow --      Pain Score 05/27/21 0929 5     Pain Loc --      Pain Edu? --      Excl. in [place]? --    No data found.  Updated Vital Signs BP 128/72    Pulse 64    Temp 98.4 F (36.9 C)    Resp 20    SpO2 98%   Visual Acuity Right Eye Distance:   Left Eye Distance:   Bilateral Distance:    Right Eye Near:   Left Eye Near:    Bilateral Near:     Physical Exam Vitals and nursing note reviewed.  Constitutional:      General: He is not in acute distress.    Appearance: He is well-developed and normal weight. He is not ill-appearing or toxic-appearing.  HENT:     Head: Normocephalic and atraumatic.     Right Ear: Tympanic membrane and ear canal normal. No drainage, swelling or tenderness. No middle ear effusion. Tympanic membrane is not erythematous.     Left Ear: Tympanic membrane and ear canal normal. No drainage, swelling or tenderness.  No middle ear effusion. Tympanic membrane is not erythematous.     Nose: No congestion or rhinorrhea.     Right Sinus: Maxillary sinus tenderness and frontal sinus tenderness present.     Left Sinus: Maxillary sinus tenderness  and frontal sinus tenderness present.     Mouth/Throat:     Mouth: Mucous membranes are moist. No  oral lesions.     Pharynx: Uvula midline. No pharyngeal swelling, oropharyngeal exudate, posterior oropharyngeal erythema or uvula swelling.     Tonsils: No tonsillar exudate or tonsillar abscesses.  Eyes:     Conjunctiva/sclera: Conjunctivae normal.     Pupils: Pupils are equal, round, and reactive to light.  Neck:     Thyroid: No thyromegaly.  Cardiovascular:     Rate and Rhythm: Normal rate and regular rhythm.     Heart sounds: No murmur heard.   No gallop.  Pulmonary:     Effort: Pulmonary effort is normal. No respiratory distress.     Breath sounds: Normal breath sounds. No stridor. No wheezing, rhonchi or rales.  Chest:     Chest wall: No tenderness.  Abdominal:     General: Bowel sounds are normal. There is no distension.     Palpations: Abdomen is soft. There is no mass.     Tenderness: There is no abdominal tenderness. There is no rebound.  Musculoskeletal:     Cervical back: Normal range of motion and neck supple.  Lymphadenopathy:     Cervical: No cervical adenopathy.  Skin:    General: Skin is warm.     Findings: No erythema or rash.  Neurological:     General: No focal deficit present.     Mental Status: He is alert and oriented to person, place, and time.  Psychiatric:        Mood and Affect: Mood normal.        Behavior: Behavior normal.     UC Treatments / Results  Labs (all labs ordered are listed, but only abnormal results are displayed) Labs Reviewed  POCT RAPID STREP A, ED / UC    EKG   Radiology No results found.  Procedures Procedures (including critical care time)  Medications Ordered in UC Medications - No data to display  Initial Impression / Assessment and Plan / UC Course  I have reviewed the triage vital signs and the nursing notes.  Pertinent labs & imaging results that were available during my care of the patient were reviewed by  me and considered in my medical decision making (see chart for details).     Acute recurrent sinusitis -given its acute onset and lack of fever, suspect it is in the viral stage.  Recommend starting with Tylenol and prednisone to get rid of the congestion and inflammation.  Monitor sugars while on steroids.  Saline spray to clean out nasal passages.  Antibiotic called in due to history of sinusitis in the past, but recommend waiting several days to see if it is indicated. Nasal congestion - as above.  Final Clinical Impressions(s) / UC Diagnoses   Final diagnoses:  Acute recurrent frontal sinusitis  Nasal congestion with rhinorrhea     Discharge Instructions      Start by taking xyzal at night and prednisone in the morning.  Please monitor your blood sugars and if any elevation >150, stop the prednisone. Use a saline spray/ sinus rinse to cleanse the sinus passages. You may also try OTC flonase nasal spray. Eucalytus or menthol can also open up the nasal passages. Moist heat vaporizor or steam from a shower may be beneficial. An antibiotic was called in for you. Please only take it after three days of the above treatment if your symptoms persist or worsen.     ED Prescriptions     Medication Sig Dispense Auth. Provider   levocetirizine (XYZAL) 5 MG tablet Take 1  tablet (5 mg total) by mouth every evening. 30 tablet Sacheen Arrasmith L, PA   predniSONE (DELTASONE) 20 MG tablet Take 1 tablet (20 mg total) by mouth daily for 5 days. 5 tablet Thornton Dohrmann L, PA   amoxicillin-clavulanate (AUGMENTIN) 500-125 MG tablet Take 1 tablet (500 mg total) by mouth 2 (two) times daily with a meal for 10 days. 20 tablet Keniah Klemmer L, [place]      PDMP not reviewed this encounter.   Chaney Malling, [place] 05/27/21 1742

## 2021-05-27 NOTE — Discharge Instructions (Addendum)
Start by taking xyzal at night and prednisone in the morning.  Please monitor your blood sugars and if any elevation >150, stop the prednisone. Use a saline spray/ sinus rinse to cleanse the sinus passages. You may also try OTC flonase nasal spray. Eucalytus or menthol can also open up the nasal passages. Moist heat vaporizor or steam from a shower may be beneficial. An antibiotic was called in for you. Please only take it after three days of the above treatment if your symptoms persist or worsen.

## 2021-06-28 ENCOUNTER — Encounter: Payer: Self-pay | Admitting: Family Medicine

## 2021-09-04 NOTE — Progress Notes (Signed)
? ? ?SUBJECTIVE:  ? ?Chief compliant/HPI: annual examination ? ?Francisco Griffin is a 61 y.o. who presents today for an annual exam.  ? 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 ?Would like to discuss options for weight loss  ?Patient feels weight gain impacts his anxiety  ?Would like to try medication to assist with this  ? ?Review of systems form reviewed and notable for weight gain and anxiety.  ? ?OBJECTIVE:  ? ?BP (!) 144/70   Pulse 71   Wt 245 lb 6.4 oz (111.3 kg)   SpO2 99%   BMI 33.28 kg/m?   ?Physical Exam ?Vitals reviewed.  ?Constitutional:   ?   General: He is not in acute distress. ?   Appearance: Normal appearance. He is not ill-appearing.  ?HENT:  ?   Right Ear: External ear normal.  ?   Left Ear: External ear normal.  ?   Nose: Nose normal.  ?   Mouth/Throat:  ?   Mouth: Mucous membranes are moist.  ?   Pharynx: No oropharyngeal exudate or posterior oropharyngeal erythema.  ?Cardiovascular:  ?   Rate and Rhythm: Normal rate and regular rhythm.  ?   Pulses: Normal pulses.  ?   Heart sounds: Normal heart sounds.  ?Pulmonary:  ?   Effort: Pulmonary effort is normal.  ?   Breath sounds: No stridor. No wheezing, rhonchi or rales.  ?Abdominal:  ?   General: Bowel sounds are normal. There is no distension.  ?   Palpations: Abdomen is soft.  ?   Tenderness: There is no abdominal tenderness.  ?Musculoskeletal:  ?   Cervical back: Normal range of motion. No rigidity or tenderness.  ?Lymphadenopathy:  ?   Cervical: No cervical adenopathy.   ?Skin: ?   Capillary Refill: Capillary refill takes less than 2 seconds.  ?   Findings: No erythema or rash.  ?Neurological:  ?   Mental Status: He is alert and oriented to person, place, and time.  ?   Sensory: No sensory deficit.  ?   Coordination: Coordination normal.  ?Psychiatric:     ?   Mood and Affect: Mood normal.  ?   Comments: Mildly anxious, expressing multiple concerns related to health, no pressured speech or objective findings of internal stimuli  ? ? ?ASSESSMENT/PLAN:  ? ?Hyperlipidemia due to type 2 diabetes mellitus ([place]) ?Continue rosuvastatin  ?Check lipid panel today  ? ?Controlled type 2 diabetes mellitus without complication, without long-term current use of insulin ([place]) ?Repeat hemoglobin A1c today  ?Increased metformin back to 1500mg  daily  ?Will trial ozempic to help with weight loss  ? ?Anxiety ?Will trial buspirone PRN for anxiety in multiple situations  ?F/u in two weeks  ? ? ?Annual Examination  ?See AVS for age appropriate recommendations.  ?PHQ score 5, reviewed and discussed.  ?Blood pressure value is not at goal, discussed, increased lisinopril to 5mg   ? ?Considered the following screening exams based upon USPSTF recommendations: ?Diabetes, well controlled: last A1c <  7.0, ordered again today  ? ?Screening for elevated cholesterol: ordered ? ?Colorectal cancer screening:  scheduled colonoscopy for November 03, 2021 ? ?Follow up in 1 year or sooner if indicated.  ? ? ?Eulis Foster, MD ?[place]  ? ?

## 2021-09-05 ENCOUNTER — Ambulatory Visit: Payer: BC Managed Care – PPO | Admitting: Family Medicine

## 2021-09-05 ENCOUNTER — Other Ambulatory Visit: Payer: Self-pay

## 2021-09-05 ENCOUNTER — Encounter: Payer: Self-pay | Admitting: Family Medicine

## 2021-09-05 VITALS — BP 144/70 | HR 71 | Wt 245.4 lb

## 2021-09-05 DIAGNOSIS — E669 Obesity, unspecified: Secondary | ICD-10-CM | POA: Diagnosis not present

## 2021-09-05 DIAGNOSIS — E119 Type 2 diabetes mellitus without complications: Secondary | ICD-10-CM

## 2021-09-05 DIAGNOSIS — I10 Essential (primary) hypertension: Secondary | ICD-10-CM

## 2021-09-05 DIAGNOSIS — Z0000 Encounter for general adult medical examination without abnormal findings: Secondary | ICD-10-CM | POA: Diagnosis not present

## 2021-09-05 DIAGNOSIS — E1169 Type 2 diabetes mellitus with other specified complication: Secondary | ICD-10-CM

## 2021-09-05 DIAGNOSIS — R030 Elevated blood-pressure reading, without diagnosis of hypertension: Secondary | ICD-10-CM

## 2021-09-05 DIAGNOSIS — E785 Hyperlipidemia, unspecified: Secondary | ICD-10-CM

## 2021-09-05 DIAGNOSIS — F419 Anxiety disorder, unspecified: Secondary | ICD-10-CM

## 2021-09-05 LAB — POCT GLYCOSYLATED HEMOGLOBIN (HGB A1C): HbA1c, POC (controlled diabetic range): 6.4 % (ref 0.0–7.0)

## 2021-09-05 MED ORDER — METFORMIN HCL 1000 MG PO TABS: 1500.0000 mg | ORAL_TABLET | Freq: Every day | ORAL | 1 refills | Status: DC

## 2021-09-05 MED ORDER — OZEMPIC (0.25 OR 0.5 MG/DOSE) 2 MG/1.5ML ~~LOC~~ SOPN: 0.5000 mg | PEN_INJECTOR | SUBCUTANEOUS | 3 refills | Status: DC

## 2021-09-05 MED ORDER — LISINOPRIL 5 MG PO TABS: 5.0000 mg | ORAL_TABLET | Freq: Every day | ORAL | 3 refills | Status: DC

## 2021-09-05 MED ORDER — BUSPIRONE HCL 5 MG PO TABS: 5.0000 mg | ORAL_TABLET | Freq: Three times a day (TID) | ORAL | 1 refills | Status: DC

## 2021-09-05 NOTE — Patient Instructions (Addendum)
I have increased the Metformin to 1500mg  daily.  ? ?I have prescribed Ozempic 0.5mg  weekly injections to help with weight loss and diabetes management.  ? ?We will check your cholesterol and A1c.  ? ? ?

## 2021-09-06 LAB — LIPID PANEL
Chol/HDL Ratio: 3.5 ratio (ref 0.0–5.0)
Cholesterol, Total: 172 mg/dL (ref 100–199)
HDL: 49 mg/dL (ref >39)
LDL Chol Calc (NIH): 92 mg/dL (ref 0–99)
Triglycerides: 181 mg/dL — ABNORMAL HIGH (ref 0–149)
VLDL Cholesterol Cal: 31 mg/dL (ref 5–40)

## 2021-09-06 LAB — COMPREHENSIVE METABOLIC PANEL
ALT: 28 IU/L (ref 0–44)
AST: 26 IU/L (ref 0–40)
Albumin/Globulin Ratio: 2 (ref 1.2–2.2)
Albumin: 4.9 g/dL — ABNORMAL HIGH (ref 3.8–4.8)
Alkaline Phosphatase: 51 IU/L (ref 44–121)
BUN/Creatinine Ratio: 15 (ref 10–24)
BUN: 15 mg/dL (ref 8–27)
Bilirubin Total: 0.5 mg/dL (ref 0.0–1.2)
CO2: 23 mmol/L (ref 20–29)
Calcium: 10.1 mg/dL (ref 8.6–10.2)
Chloride: 101 mmol/L (ref 96–106)
Creatinine, Ser: 1.03 mg/dL (ref 0.76–1.27)
Globulin, Total: 2.5 g/dL (ref 1.5–4.5)
Glucose: 82 mg/dL (ref 70–99)
Potassium: 4.7 mmol/L (ref 3.5–5.2)
Sodium: 142 mmol/L (ref 134–144)
Total Protein: 7.4 g/dL (ref 6.0–8.5)
eGFR: 83 mL/min/{1.73_m2} (ref >59)

## 2021-09-07 DIAGNOSIS — F419 Anxiety disorder, unspecified: Secondary | ICD-10-CM | POA: Insufficient documentation

## 2021-09-07 DIAGNOSIS — R030 Elevated blood-pressure reading, without diagnosis of hypertension: Secondary | ICD-10-CM | POA: Insufficient documentation

## 2021-09-07 NOTE — Assessment & Plan Note (Signed)
Will trial buspirone PRN for anxiety in multiple situations  ?F/u in two weeks  ?

## 2021-09-07 NOTE — Assessment & Plan Note (Signed)
Continue rosuvastatin  ?Check lipid panel today  ?

## 2021-09-07 NOTE — Assessment & Plan Note (Signed)
Repeat hemoglobin A1c today  ?Increased metformin back to 1500mg  daily  ?Will trial ozempic to help with weight loss  ?

## 2021-09-14 ENCOUNTER — Other Ambulatory Visit: Payer: Self-pay | Admitting: Family Medicine

## 2021-09-14 DIAGNOSIS — B009 Herpesviral infection, unspecified: Secondary | ICD-10-CM

## 2021-09-21 ENCOUNTER — Encounter: Payer: Self-pay | Admitting: Family Medicine

## 2021-09-28 ENCOUNTER — Other Ambulatory Visit: Payer: Self-pay | Admitting: Family Medicine

## 2021-09-28 DIAGNOSIS — E119 Type 2 diabetes mellitus without complications: Secondary | ICD-10-CM

## 2021-09-29 NOTE — Patient Instructions (Signed)
It was a pleasure to see you today! ? ?Thank you for choosing [facility] for your primary care.  ? ?Francisco Griffin was seen for follow up for anxiety and elevated blood pressure.  ? ?Our plans for today were: ?Blood pressure: Please continue your lisinopril 5mg  daily. I would also recommend checking your blood pressure 3-4 times per week at different times throughout the day.  ?Please be mindful of any lightheadedness or feeling that you may pass out as this can indicate that your blood pressure may be too low. ?Please refer to [email or web address] for more information and tips to manage blood pressure.   ?For your feelings of anxiety you can continue your Buspar as needed.  ? ? ?You should return to our clinic in 1 month for weight loss/DM f/u and discuss triglyceride medication.  ? ?Best Wishes,  ? ?Dr.  ? ? ?Ezetimibe Tablets ?What is this medication? ?EZETIMIBE (ez ET i mibe) treats high cholesterol. It works by reducing the amount of cholesterol absorbed from the food you eat. This decreases the amount of bad cholesterol (such as LDL) in your blood. Changes to diet and exercise are often combined with this medication. ?This medicine may be used for other purposes; ask your health care provider or pharmacist if you have questions. ?COMMON BRAND NAME(S): Zetia ?What should I tell my care team before I take this medication? ?They need to know if you have any of these conditions: ?Kidney disease ?Liver disease ?Muscle cramps, pain ?Muscle injury ?Thyroid disease ?An unusual or allergic reaction to ezetimibe, other medications, foods, dyes, or preservatives ?Pregnant or trying to get pregnant ?Breast-feeding ?How should I use this medication? ?Take this medication by mouth. Take it as directed on the prescription label at the same time every day. You can take it with or without food. If it upsets your stomach, take it with food. Keep taking it unless your care team tells you to stop. ?Take bile  acid sequestrants at a different time of day than this medication. Take this medication 2 hours BEFORE or 4 hours AFTER bile acid sequestrants. ?Talk to your care team about the use of this medication in children. While it may be prescribed for children as young as 10 for selected conditions, precautions do apply. ?Overdosage: If you think you have taken too much of this medicine contact a poison control center or emergency room at once. ?NOTE: This medicine is only for you. Do not share this medicine with others. ?What if I miss a dose? ?If you miss a dose, take it as soon as you can. If it is almost time for your next dose, take only that dose. Do not take double or extra doses. ?What may interact with this medication? ?Do not take this medication with any of the following: ?Fenofibrate ?Gemfibrozil ?This medication may also interact with the following: ?Antacids ?Cyclosporine ?Herbal medications like red yeast rice ?Other medications to lower cholesterol or triglycerides ?This list may not describe all possible interactions. Give your health care provider a list of all the medicines, herbs, non-prescription drugs, or dietary supplements you use. Also tell them if you smoke, drink alcohol, or use illegal drugs. Some items may interact with your medicine. ?What should I watch for while using this medication? ?Visit your care team for regular checks on your progress. Tell your care team if your symptoms do not start to get better or if they get worse. ?Your care team may tell you to stop taking this medication  if you develop muscle problems. If your muscle problems do not go away after stopping this medication, contact your care team. ?Do not become pregnant while taking this medication. Women should inform their care team if they wish to become pregnant or think they might be pregnant. There is potential for serious harm to an unborn child. Talk to your care team for more information. Do not breast-feed an infant  while taking this medication. ?Taking this medication is only part of a total heart healthy program. Your care team may give you a special diet to follow. Avoid alcohol. Avoid smoking. Ask your care team how much you should exercise. ?What side effects may I notice from receiving this medication? ?Side effects that you should report to your doctor or health care provider as soon as possible: ?Allergic reactions--skin rash, itching or hives, swelling of the face, lips, tongue, or throat ?Side effects that usually do not require medical attention (report to your doctor or health care provider if they continue or are bothersome): ?Diarrhea ?Joint pain ?This list may not describe all possible side effects. Call your doctor for medical advice about side effects. You may report side effects to FDA at 1-800-FDA-1088. ?Where should I keep my medication? ?Keep out of the reach of children and pets. ?Store at room temperature between 15 and 30 degrees C (59 and 86 degrees F). Protect from moisture. Get rid of any unused medication after the expiration date. ?NOTE: This sheet is a summary. It may not cover all possible information. If you have questions about this medicine, talk to your doctor, pharmacist, or health care provider. ?? 2023 Elsevier/Gold Standard (2020-06-11 00:00:00) ? ?

## 2021-09-29 NOTE — Progress Notes (Signed)
? ? ?SUBJECTIVE:  ? ?CHIEF COMPLAINT / HPI: f/u anxiety and BP  ? ?Elevated BP  ?Patient presents for follow up regarding elevated BP on 4/10.  ?At that time, we increased his ACEi lisinopril to 5mg  daily  ?Since then he reports  ?- Checking BP at home: no  ?- Denies any SOB, CP, vision changes, LE edema, medication SEs, or symptoms of hypotension ?- Diet: small breakfast and lunch, limits breads, yogurt with granola, may eat sandwich 1/2 and soup; cut out french fries, fav snack is peanuts (admits to maybe having more of these than he should)  ?- Exercise: walking 1 mile per day for 4-5 days per week, does some sit ups and stretches to help with core and back strength  ? ? ?Anxiety  ?At his last visit, we started buspirone 7.5mg  TID PRN  ?He states that anxiety is improved  ?His GAD 7 today indicates:   ? ?  09/30/2021  ?  8:59 AM 09/05/2021  ?  3:17 PM  ?GAD 7 : Generalized Anxiety Score  ?Nervous, Anxious, on Edge 0 1  ?Control/stop worrying 0 1  ?Worry too much - different things 1 1  ?Trouble relaxing 1 1  ?Restless 0 0  ?Easily annoyed or irritable 0 0  ?Afraid - awful might happen 0 1  ?Total GAD 7 Score 2 5  ?Anxiety Difficulty Not difficult at all Not difficult at all  ? ?DM  ?Ozempic has helped with concern for DM and seems to help anxiety as well  ?He continues to manage carbohydrates in his diet and has increased his walking to help with exercise  ?He denies any GI upset related to starting ozempic  ?States that he did have a mild HA after starting the injection but this was resolved with taking tylenol ? ?HLD  ?Patient continues to take rosuvastatin 5mg  daily and diet as mentioned above ?He denies muscle aches with medication  ? ? ? ?PERTINENT  PMH / PSH:  ?Controlled T2DM  ?GERD ?HLD  ?BMI 30  ?Elevated BP without HTN  ?Anxiety  ? ?OBJECTIVE:  ? ?BP 130/64   Pulse 69   Ht 6' (1.829 m)   Wt 242 lb (109.8 kg)   SpO2 98%   BMI 32.82 kg/m?   ?Physical Exam ?Vitals reviewed.  ?Constitutional:   ?    General: He is not in acute distress. ?   Appearance: Normal appearance. He is not ill-appearing.  ?HENT:  ?   Right Ear: External ear normal.  ?   Left Ear: External ear normal.  ?   Nose: Nose normal.  ?Eyes:  ?   Conjunctiva/sclera: Conjunctivae normal.  ?Cardiovascular:  ?   Rate and Rhythm: Normal rate and regular rhythm.  ?   Pulses: Normal pulses.  ?   Heart sounds: Normal heart sounds. No murmur heard. ?Abdominal:  ?   General: Bowel sounds are normal. There is no distension.  ?   Palpations: Abdomen is soft.  ?   Tenderness: There is no abdominal tenderness.  ?Musculoskeletal:     ?   General: No deformity or signs of injury. Normal range of motion.  ?   Cervical back: Normal range of motion and neck supple.  ?Skin: ?   General: Skin is warm and dry.  ?   Findings: No erythema or rash.  ?Neurological:  ?   Mental Status: He is alert and oriented to person, place, and time.  ?Psychiatric:     ?  Mood and Affect: Mood normal.     ?   Behavior: Behavior normal.     ?   Thought Content: Thought content normal.  ? ? ?ASSESSMENT/PLAN:  ? ?Anxiety ?Improved GAD score and subjective feelings of anxiety on buspar 7.5mg  PRN, continue current therapy  ? ?Elevated blood pressure reading in office without diagnosis of hypertension ?BP 130/64 today  ?Will continue lisinopril 5mg  daily  ?Patient given resources with tips on how to measure blood pressure at home  ?See AVS  ? ?Hyperlipidemia due to type 2 diabetes mellitus ([place]) ?Reviewed lipid panel, LDL 92, goal <100  ?TG elevated  ?Discussed adding potential agent, patient given information to review and we will follow up in 1 month  ?  ? ? ? ?Eulis Foster, MD ?[place]  ?

## 2021-09-30 ENCOUNTER — Encounter: Payer: Self-pay | Admitting: Family Medicine

## 2021-09-30 ENCOUNTER — Ambulatory Visit: Payer: BC Managed Care – PPO | Admitting: Family Medicine

## 2021-09-30 ENCOUNTER — Other Ambulatory Visit: Payer: Self-pay | Admitting: Family Medicine

## 2021-09-30 VITALS — BP 130/64 | HR 69 | Ht 72.0 in | Wt 242.0 lb

## 2021-09-30 DIAGNOSIS — R030 Elevated blood-pressure reading, without diagnosis of hypertension: Secondary | ICD-10-CM

## 2021-09-30 DIAGNOSIS — E785 Hyperlipidemia, unspecified: Secondary | ICD-10-CM | POA: Diagnosis not present

## 2021-09-30 DIAGNOSIS — E1169 Type 2 diabetes mellitus with other specified complication: Secondary | ICD-10-CM | POA: Diagnosis not present

## 2021-09-30 DIAGNOSIS — F419 Anxiety disorder, unspecified: Secondary | ICD-10-CM

## 2021-09-30 NOTE — Assessment & Plan Note (Signed)
Reviewed lipid panel, LDL 92, goal <100  ?TG elevated  ?Discussed adding potential agent, patient given information to review and we will follow up in 1 month  ?

## 2021-09-30 NOTE — Assessment & Plan Note (Signed)
Improved GAD score and subjective feelings of anxiety on buspar 7.5mg  PRN, continue current therapy  ?

## 2021-09-30 NOTE — Assessment & Plan Note (Signed)
BP 130/64 today  ?Will continue lisinopril 5mg  daily  ?Patient given resources with tips on how to measure blood pressure at home  ?See AVS  ?

## 2021-10-20 ENCOUNTER — Other Ambulatory Visit: Payer: Self-pay | Admitting: Family Medicine

## 2021-10-20 DIAGNOSIS — E119 Type 2 diabetes mellitus without complications: Secondary | ICD-10-CM

## 2021-11-01 ENCOUNTER — Encounter: Payer: Self-pay | Admitting: *Deleted

## 2021-11-06 NOTE — Progress Notes (Signed)
    SUBJECTIVE:   CHIEF COMPLAINT / HPI: f/u DM and weight loss   Patient presents for follow up regarding weight loss on GLP-1 agonist.  He is currently on semaglutide at 0.5mg  weekly.  He reports having GI upset including  nausea &  diarrhea.  He reports having to take a lot of pepto bismol to manage the symptoms and has been eating more carbohydrates to combat the diarrhea  He states that he normally has worsening SE when continuing medications rather than getting accustomed to the SE and is not satisfied with the weight loss so he would prefer to DC this med    Hypertriglyceridemia Last lipid panel showed triglycerides of 181.  Patient reports adherence to statin therapy.  He is agreeable to starting adjunct therapy to manage triglycerides.  Patient states that he manages carbohydrate intake but reports increasing carbohydrate intake with GI upset as discussed above   PERTINENT  PMH / PSH:  Well controlled DM  HLD    OBJECTIVE:   BP 118/80   Pulse 69   Ht 6' (1.829 m)   Wt 242 lb (109.8 kg)   SpO2 99%   BMI 32.82 kg/m   Physical Exam Vitals reviewed.  Constitutional:      General: He is not in acute distress.    Appearance: Normal appearance. He is not ill-appearing, toxic-appearing or diaphoretic.  HENT:     Head: Normocephalic and atraumatic.     Right Ear: Tympanic membrane and external ear normal.     Left Ear: Tympanic membrane and external ear normal.     Nose: Nose normal.     Mouth/Throat:     Mouth: Mucous membranes are moist.     Pharynx: No oropharyngeal exudate or posterior oropharyngeal erythema.  Eyes:     Extraocular Movements: Extraocular movements intact.     Conjunctiva/sclera: Conjunctivae normal.     Pupils: Pupils are equal, round, and reactive to light.  Cardiovascular:     Rate and Rhythm: Normal rate and regular rhythm.     Pulses: Normal pulses.     Heart sounds: Normal heart sounds. No murmur heard. Musculoskeletal:        General: No  swelling, tenderness or signs of injury. Normal range of motion.     Cervical back: Normal range of motion and neck supple.  Skin:    General: Skin is warm and dry.     Findings: No erythema or rash.  Neurological:     Mental Status: He is alert.    ASSESSMENT/PLAN:   Controlled type 2 diabetes mellitus without complication, without long-term current use of insulin (HCC) Continues to manage diabetes with diet We will discontinue semaglutide as patient has a minimal weight loss and has nausea and diarrhea associated with injections Diabetes foot exam completed today   Hypertriglyceridemia Patient will continue Crestor for lipid management Recommended starting Zetia 10 mg for triglyceride management We will follow-up in 6 weeks for triglyceride measurement Future order placed for this lab     Ronnald Ramp, MD [facility]

## 2021-11-07 ENCOUNTER — Encounter: Payer: Self-pay | Admitting: Family Medicine

## 2021-11-07 ENCOUNTER — Ambulatory Visit: Payer: BC Managed Care – PPO | Admitting: Family Medicine

## 2021-11-07 VITALS — BP 118/80 | HR 69 | Ht 72.0 in | Wt 242.0 lb

## 2021-11-07 DIAGNOSIS — E781 Pure hyperglyceridemia: Secondary | ICD-10-CM | POA: Insufficient documentation

## 2021-11-07 DIAGNOSIS — E119 Type 2 diabetes mellitus without complications: Secondary | ICD-10-CM

## 2021-11-07 MED ORDER — EZETIMIBE 10 MG PO TABS: 10.0000 mg | ORAL_TABLET | Freq: Every day | ORAL | 3 refills | Status: DC

## 2021-11-07 MED ORDER — METFORMIN HCL 1000 MG PO TABS: ORAL_TABLET | ORAL | 1 refills | Status: DC

## 2021-11-07 NOTE — Assessment & Plan Note (Signed)
Patient will continue Crestor for lipid management Recommended starting Zetia 10 mg for triglyceride management We will follow-up in 6 weeks for triglyceride measurement Future order placed for this lab

## 2021-11-07 NOTE — Assessment & Plan Note (Addendum)
Continues to manage diabetes with diet We will discontinue semaglutide as patient has a minimal weight loss and has nausea and diarrhea associated with injections Diabetes foot exam completed today

## 2021-11-07 NOTE — Patient Instructions (Signed)
I have prescribed Zetia for your triglyceride management.  We will plan to check your triglycerides in 6 weeks.  Please make an appointment for follow-up at that time.  We have also decided to stop the semaglutide injections. Your medication list has been updated.

## 2022-01-16 ENCOUNTER — Encounter: Payer: Self-pay | Admitting: Family Medicine

## 2022-01-16 DIAGNOSIS — E119 Type 2 diabetes mellitus without complications: Secondary | ICD-10-CM

## 2022-01-16 MED ORDER — METFORMIN HCL 1000 MG PO TABS: ORAL_TABLET | ORAL | 1 refills | Status: DC

## 2022-02-27 ENCOUNTER — Ambulatory Visit: Payer: BC Managed Care – PPO | Admitting: Pulmonary Disease

## 2022-03-09 ENCOUNTER — Encounter: Payer: Self-pay | Admitting: Pulmonary Disease

## 2022-03-09 ENCOUNTER — Ambulatory Visit (INDEPENDENT_AMBULATORY_CARE_PROVIDER_SITE_OTHER): Payer: BC Managed Care – PPO | Admitting: Pulmonary Disease

## 2022-03-09 DIAGNOSIS — G4733 Obstructive sleep apnea (adult) (pediatric): Secondary | ICD-10-CM | POA: Diagnosis not present

## 2022-03-09 NOTE — Patient Instructions (Signed)
CPAP is working well with current settings. Trial of AirFit F30 fullface mask

## 2022-03-09 NOTE — Assessment & Plan Note (Signed)
CPAP download was reviewed which shows good control of events on auto settings 8 to 14 cm with average pressure of 11.4 and maximum pressure of 13 cm.  He has mild leak, compliance is great more than 6.5 hours per night.  CPAP is only helped improve his daytime somnolence and fatigue  Weight loss encouraged, compliance with goal of at least 4-6 hrs every night is the expectation. Advised against medications with sedative side effects Cautioned against driving when sleepy - understanding that sleepiness will vary on a day to day basis

## 2022-03-09 NOTE — Addendum Note (Signed)
Addended by: Irine Seal B on: 03/09/2022 01:21 PM   Modules accepted: Orders

## 2022-03-09 NOTE — Progress Notes (Signed)
   Subjective:    Patient ID: Francisco Griffin, male    DOB: 1960/11/08, 61 y.o.   MRN: [id number]  HPI  61 yo man for follow-up of OSA & nasal allergies -on auto CPAP 8-14 cm -on CPAP since 2013 after his initial study at [facility] He has seasonal allergies predominant nasal congestion worse in spring and fall.  Chlorpheniramine and nasal decongestants provide relief, Nasonex has not helped.  Chief Complaint  Patient presents with   Follow-up    Pt states he has had some anxiety and was prescribed meds and is doing better since LOV   Annual follow-up visit Compliant with CPAP machine.  He has an AirSense 10 and uses a fullface F20 mask no problems with mask or pressure.  He wakes up feeling rested, no mouth dryness. Denies seasonal allergies    Review of Systems neg for any significant sore throat, dysphagia, itching, sneezing, nasal congestion or excess/ purulent secretions, fever, chills, sweats, unintended wt loss, pleuritic or exertional cp, hempoptysis, orthopnea pnd or change in chronic leg swelling. Also denies presyncope, palpitations, heartburn, abdominal pain, nausea, vomiting, diarrhea or change in bowel or urinary habits, dysuria,hematuria, rash, arthralgias, visual complaints, headache, numbness weakness or ataxia.     Objective:   Physical Exam  Gen. Pleasant, obese, in no distress ENT - no lesions, no post nasal drip Neck: No JVD, no thyromegaly, no carotid bruits Lungs: no use of accessory muscles, no dullness to percussion, decreased without rales or rhonchi  Cardiovascular: Rhythm regular, heart sounds  normal, no murmurs or gallops, no peripheral edema Musculoskeletal: No deformities, no cyanosis or clubbing , no tremors        Assessment & Plan:

## 2022-03-14 ENCOUNTER — Other Ambulatory Visit: Payer: Self-pay | Admitting: Family Medicine

## 2022-03-14 DIAGNOSIS — B009 Herpesviral infection, unspecified: Secondary | ICD-10-CM

## 2022-03-20 ENCOUNTER — Other Ambulatory Visit: Payer: Self-pay

## 2022-03-20 DIAGNOSIS — E119 Type 2 diabetes mellitus without complications: Secondary | ICD-10-CM

## 2022-03-20 MED ORDER — METFORMIN HCL 1000 MG PO TABS: ORAL_TABLET | ORAL | 1 refills | Status: DC

## 2022-03-23 ENCOUNTER — Ambulatory Visit: Payer: BC Managed Care – PPO | Admitting: Family Medicine

## 2022-03-28 ENCOUNTER — Encounter: Payer: Self-pay | Admitting: Family Medicine

## 2022-03-28 ENCOUNTER — Ambulatory Visit (INDEPENDENT_AMBULATORY_CARE_PROVIDER_SITE_OTHER): Payer: BC Managed Care – PPO | Admitting: Family Medicine

## 2022-03-28 VITALS — BP 136/74 | HR 67 | Wt 246.0 lb

## 2022-03-28 DIAGNOSIS — E119 Type 2 diabetes mellitus without complications: Secondary | ICD-10-CM | POA: Diagnosis not present

## 2022-03-28 DIAGNOSIS — E785 Hyperlipidemia, unspecified: Secondary | ICD-10-CM

## 2022-03-28 DIAGNOSIS — E781 Pure hyperglyceridemia: Secondary | ICD-10-CM

## 2022-03-28 DIAGNOSIS — E669 Obesity, unspecified: Secondary | ICD-10-CM

## 2022-03-28 DIAGNOSIS — E1169 Type 2 diabetes mellitus with other specified complication: Secondary | ICD-10-CM

## 2022-03-28 DIAGNOSIS — E66811 Obesity, class 1: Secondary | ICD-10-CM

## 2022-03-28 LAB — POCT GLYCOSYLATED HEMOGLOBIN (HGB A1C): HbA1c, POC (controlled diabetic range): 6.3 % (ref 0.0–7.0)

## 2022-03-28 MED ORDER — ROSUVASTATIN CALCIUM 10 MG PO TABS: 10.0000 mg | ORAL_TABLET | Freq: Every day | ORAL | 1 refills | Status: DC

## 2022-03-28 NOTE — Progress Notes (Signed)
    SUBJECTIVE:   CHIEF COMPLAINT / HPI:   Francisco Griffin is 61yo M h/f hypertriglyceridemia.  Hypertriglyceridemia Pt was started on Zetia at prior visit, denies any adverse effects. He is also on Crestor 5mg  daily. He previously tried Lipitor but stopped due to muscle aches in the chest. He has not experienced this with Crestor.  Weight Pt heard his friend had success working with [facility]. He has tried Ozempic in the past for weight, but did not find it helpful. He is motivated to lose weight.  Eye exam - Pt plans to make appointment for diabetic eye exam  PERTINENT  PMH / PSH: HTN, OSA, hypertriglyceridemia, T2DM  OBJECTIVE:   BP 136/74   Pulse 67   Wt 246 lb (111.6 kg)   SpO2 95%   BMI 31.58 kg/m   Gen: Pleasant, friendly man. NAD. HEENT: NCAT. Sclera anicteric Resp: CTAB. Normal WOB on RA CV: RRR, no murmurs. Radial pulses 2+.  ASSESSMENT/PLAN:   Hyperlipidemia due to type 2 diabetes mellitus ([place]) Was started on Zetia at last visit for hypertriglyceride. Pt has fam hx of heart dz and is motivated in decreasing his risk. He was previously on Lipitor but stopped due to muscle aches in chest. Denies any myalgias on Crestor 5mg . Fasting lipid panel today shows triglycerides now wnl. - Increase Crestor 5 to 10mg  daily. Monitor for myalgias. - Cont Zetia  Hypertriglyceridemia Improved w/ starting Zetia  Controlled type 2 diabetes mellitus without complication, without long-term current use of insulin (HCC) A1c at goal. Urine microalbumin/Cr wnl. - Pt plans to schedule diabetic eye exam  Obesity (BMI 30.0-34.9) Pt interested in lifestyle modifications to help with weight loss. Has previously tried Ozempic with limited benefit.  - Referral to Dr Jenne Campus   Arlyce Dice, [place]

## 2022-03-28 NOTE — Patient Instructions (Addendum)
Good to see you today - Thank you for coming in  Things we discussed today:  1) High Triglycerides: - Take Crestor 10mg  daily. If you notice muscle aches, let us know via MyChart and you can go back to taking 5mg  daily. - Continue taking Zetia - We will check your triglycerides today  2) Weight: - I have sent a referral for you to see our nutritionist, Dr. Iver Nestle. Please call her to schedule an appointment [phone])  3) Schedule with your ophthalmologist to have a Diabetic Eye exam.  Please always bring your medication bottles  Come back to see me in 6 months to follow-up on cholesterol and triglycerides.

## 2022-03-29 LAB — MICROALBUMIN / CREATININE URINE RATIO
Creatinine, Urine: 140.8 mg/dL
Microalb/Creat Ratio: <2 mg/g creat (ref 0–29)
Microalbumin, Urine: <3 ug/mL

## 2022-03-29 LAB — TRIGLYCERIDES: Triglycerides: 107 mg/dL (ref 0–149)

## 2022-04-02 NOTE — Assessment & Plan Note (Addendum)
A1c at goal. Urine microalbumin/Cr wnl. - Pt plans to schedule diabetic eye exam

## 2022-04-02 NOTE — Assessment & Plan Note (Signed)
Improved w/ starting Zetia

## 2022-04-02 NOTE — Assessment & Plan Note (Addendum)
Was started on Zetia at last visit for hypertriglyceride. Pt has fam hx of heart dz and is motivated in decreasing his risk. He was previously on Lipitor but stopped due to muscle aches in chest. Denies any myalgias on Crestor 5mg . Fasting lipid panel today shows triglycerides now wnl. - Increase Crestor 5 to 10mg  daily. Monitor for myalgias. - Cont Zetia

## 2022-04-02 NOTE — Assessment & Plan Note (Addendum)
Pt interested in lifestyle modifications to help with weight loss. Has previously tried Ozempic with limited benefit.  - Referral to Dr Jenne Campus

## 2022-04-05 ENCOUNTER — Encounter: Payer: Self-pay | Admitting: Family Medicine

## 2022-04-05 ENCOUNTER — Other Ambulatory Visit: Payer: Self-pay | Admitting: Family Medicine

## 2022-04-05 DIAGNOSIS — B009 Herpesviral infection, unspecified: Secondary | ICD-10-CM

## 2022-04-05 NOTE — Telephone Encounter (Signed)
Requested medication (s) are due for refill today: yes  Requested medication (s) are on the active medication list: yes  Last refill:  09/14/21  Future visit scheduled: no  Notes to clinic:  Unable to refill per protocol, Rx not assigned to protocol routing for review.      Requested Prescriptions  Pending Prescriptions Disp Refills   valACYclovir (VALTREX) 500 MG tablet [Pharmacy Med Name: VALACYCLOVIR HCL 500 MG TABLET] 90 tablet 1    Sig: TAKE 1 TABLET BY MOUTH EVERY DAY     There is no refill protocol information for this order

## 2022-04-06 ENCOUNTER — Other Ambulatory Visit: Payer: Self-pay | Admitting: Family Medicine

## 2022-04-06 DIAGNOSIS — B009 Herpesviral infection, unspecified: Secondary | ICD-10-CM

## 2022-04-07 NOTE — Telephone Encounter (Signed)
Patient returns call to nurse line to check status of rx refill.   Jessy Cybulski C Yuji Walth, RN  

## 2022-04-10 ENCOUNTER — Encounter: Payer: Self-pay | Admitting: Family Medicine

## 2022-04-10 NOTE — Telephone Encounter (Signed)
Patient calls nurse line again in regards to refill request.

## 2022-08-28 ENCOUNTER — Other Ambulatory Visit: Payer: Self-pay | Admitting: Family Medicine

## 2022-08-28 DIAGNOSIS — E119 Type 2 diabetes mellitus without complications: Secondary | ICD-10-CM

## 2022-09-08 ENCOUNTER — Other Ambulatory Visit: Payer: Self-pay | Admitting: Family Medicine

## 2022-09-24 ENCOUNTER — Other Ambulatory Visit: Payer: Self-pay | Admitting: Family Medicine

## 2022-09-24 DIAGNOSIS — B009 Herpesviral infection, unspecified: Secondary | ICD-10-CM

## 2022-11-17 LAB — HM DIABETES EYE EXAM

## 2022-12-01 ENCOUNTER — Other Ambulatory Visit: Payer: Self-pay | Admitting: Family Medicine

## 2022-12-01 NOTE — Telephone Encounter (Signed)
Requested medication (s) are due for refill today: yes  Requested medication (s) are on the active medication list: yes  Last refill:  03/28/22  Future visit scheduled: no  Notes to clinic:  Unable to refill per protocol, last refill by another provider. Routing for approval.     Requested Prescriptions  Pending Prescriptions Disp Refills   rosuvastatin (CRESTOR) 10 MG tablet [Pharmacy Med Name: ROSUVASTATIN CALCIUM 10 MG TAB] 45 tablet 3    Sig: TAKE 1/2 TABLET BY MOUTH DAILY     There is no refill protocol information for this order

## 2022-12-10 ENCOUNTER — Other Ambulatory Visit: Payer: Self-pay | Admitting: Family Medicine

## 2022-12-10 DIAGNOSIS — E119 Type 2 diabetes mellitus without complications: Secondary | ICD-10-CM

## 2023-03-13 ENCOUNTER — Encounter: Payer: Self-pay | Admitting: Family Medicine

## 2023-03-27 ENCOUNTER — Telehealth: Payer: Self-pay

## 2023-03-27 DIAGNOSIS — B009 Herpesviral infection, unspecified: Secondary | ICD-10-CM

## 2023-03-27 NOTE — Telephone Encounter (Signed)
[facility] sent fax informing provider that the patient is requesting a new rx for Valtrex [facility] to [facility] [address]. Penni Bombard CMA

## 2023-03-28 ENCOUNTER — Other Ambulatory Visit: Payer: Self-pay

## 2023-03-28 DIAGNOSIS — B009 Herpesviral infection, unspecified: Secondary | ICD-10-CM

## 2023-03-28 MED ORDER — VALACYCLOVIR HCL 500 MG PO TABS: 500.0000 mg | ORAL_TABLET | Freq: Every day | ORAL | 1 refills | Status: DC

## 2023-04-23 NOTE — Progress Notes (Unsigned)
New patient visit   Patient: Francisco Griffin   DOB: 01-24-1961   62 y.o. Male  MRN: [id number] Visit Date: 04/24/2023  Today's healthcare provider: Ronnald Ramp, MD   No chief complaint on file.  Subjective    Francisco Griffin is a 62 y.o. male who presents today as a new patient to establish care.      Discussed the use of AI scribe software for clinical note transcription with the patient, who gave verbal consent to proceed.  History of Present Illness              Past Medical History:  Diagnosis Date   Allergy    Anxiety    Diabetes mellitus without complication (HCC)    GERD (gastroesophageal reflux disease)    Hyperlipidemia    Hypertension    Sleep apnea    cpap    Outpatient Medications Prior to Visit  Medication Sig   aspirin EC 81 MG tablet Take 81 mg by mouth daily.   b complex vitamins capsule Take 1 capsule by mouth daily.   busPIRone (BUSPAR) 5 MG tablet Take 1 tablet (5 mg total) by mouth 3 (three) times daily.   Chromium-Cinnamon 100-500 MCG-MG CAPS Take 1 capsule by mouth daily.   ezetimibe (ZETIA) 10 MG tablet TAKE 1 TABLET BY MOUTH EVERY DAY   Ibuprofen (MOTRIN PO) Take by mouth.   lisinopril (ZESTRIL) 5 MG tablet TAKE 1 TABLET BY MOUTH EVERYDAY AT BEDTIME   metFORMIN (GLUCOPHAGE) 1000 MG tablet TAKE 1 TABLET (1,000 MG TOTAL) BY MOUTH DAILY WITH LUNCH AND 0.5 TABLETS (500 MG TOTAL) AT BEDTIME.   Naproxen Sodium (ALEVE PO) Take by mouth.   rosuvastatin (CRESTOR) 10 MG tablet TAKE 1/2 TABLET BY MOUTH DAILY   valACYclovir (VALTREX) 500 MG tablet Take 1 tablet (500 mg total) by mouth daily.   No facility-administered medications prior to visit.    Past Surgical History:  Procedure Laterality Date   COLONOSCOPY     FRACTURE SURGERY     5th grade broke wrist   WISDOM TOOTH EXTRACTION     Family Status  Relation Name Status   Mother  Deceased   Father  Deceased   Sister  Alive   MGM  Deceased   MGF  Deceased   PGM  Deceased    PGF  Deceased   Neg Hx  (Not Specified)  No partnership data on file   Family History  Problem Relation Age of Onset   Cancer Mother    Heart disease Father    Pulmonary fibrosis Father    Hyperlipidemia Father    Hypertension Father    Diabetes Father    Hyperlipidemia Sister    Asthma Sister    Esophageal cancer Maternal Grandfather    Cancer Maternal Grandfather    Heart disease Paternal Grandmother    Diabetes Paternal Grandfather    Heart disease Paternal Grandfather    Colon cancer Neg Hx    Colon polyps Neg Hx    Stomach cancer Neg Hx    Rectal cancer Neg Hx    Social History   Socioeconomic History   Marital status: Married    Spouse name: Not on file   Number of children: Not on file   Years of education: Not on file   Highest education level: Not on file  Occupational History   Not on file  Tobacco Use   Smoking status: Never    Passive exposure: Never   Smokeless tobacco:  Never  Vaping Use   Vaping status: Never Used  Substance and Sexual Activity   Alcohol use: Yes    Alcohol/week: 1.0 standard drink of alcohol    Types: 1 Cans of beer per week   Drug use: No   Sexual activity: Not on file  Other Topics Concern   Not on file  Social History Narrative   Not on file   Social Determinants of Health   Financial Resource Strain: Not on file  Food Insecurity: Not on file  Transportation Needs: Not on file  Physical Activity: Not on file  Stress: Not on file  Social Connections: Unknown (10/09/2021)   [facility]   Social Network    Social Network: Not on file     Allergies  Allergen Reactions   Lipitor [Atorvastatin] Other (See Comments)    Muscle aches across his chest    Immunization History  Administered Date(s) Administered   Influenza, Quadrivalent, Recombinant, Inj, Pf 02/22/2022   Influenza,inj,Quad PF,6+ Mos 05/07/2018, 01/09/2019   PFIZER Comirnaty(Gray Top)Covid-19 Tri-Sucrose Vaccine 10/02/2020    PFIZER(Purple Top)SARS-COV-2 Vaccination 04/24/2020, 02/22/2022   PNEUMOCOCCAL CONJUGATE-20 10/02/2020   Pfizer Covid-19 Vaccine Bivalent Booster 17yrs & up 02/15/2021   Pneumococcal Polysaccharide-23 01/11/2017   Tdap 01/11/2017   Zoster Recombinant(Shingrix) 07/29/2017, 10/11/2017    Health Maintenance  Topic Date Due   Diabetic kidney evaluation - eGFR measurement  09/06/2022   HEMOGLOBIN A1C  09/26/2022   FOOT EXAM  11/08/2022   INFLUENZA VACCINE  12/28/2022   COVID-19 Vaccine (5 - 2023-24 season) 01/28/2023   Diabetic kidney evaluation - Urine ACR  03/29/2023   OPHTHALMOLOGY EXAM  11/17/2023   DTaP/Tdap/Td (2 - Td or Tdap) 01/12/2027   Colonoscopy  11/12/2031   Hepatitis C Screening  Completed   HIV Screening  Completed   Zoster Vaccines- Shingrix  Completed   HPV VACCINES  Aged Out    Patient Care Team: Lockie Mola, MD as PCP - General (Family Medicine)  Review of Systems  {Insert previous labs (optional):23779} {See past labs  Heme  Chem  Endocrine  Serology  Results Review (optional):1}   Objective    There were no vitals taken for this visit. {Insert last BP/Wt (optional):23777}{See vitals history (optional):1}    Depression Screen    03/28/2022    8:45 AM 11/07/2021    8:57 AM 09/30/2021    8:59 AM 09/05/2021    3:17 PM  PHQ 2/9 Scores  PHQ - 2 Score 1 0 0 2  PHQ- 9 Score 3 2 2 5    No results found for any visits on 04/24/23.   Physical Exam ***    Assessment & Plan      Problem List Items Addressed This Visit   None   Assessment and Plan               No follow-ups on file.      Ronnald Ramp, MD  [facility] [phone] (phone) [phone] (fax)  [facility]

## 2023-04-24 ENCOUNTER — Ambulatory Visit: Payer: BC Managed Care – PPO | Admitting: Family Medicine

## 2023-04-24 ENCOUNTER — Encounter: Payer: Self-pay | Admitting: Family Medicine

## 2023-04-24 VITALS — BP 126/66 | HR 68 | Resp 18 | Ht 72.0 in | Wt 244.0 lb

## 2023-04-24 DIAGNOSIS — R002 Palpitations: Secondary | ICD-10-CM | POA: Insufficient documentation

## 2023-04-24 DIAGNOSIS — K219 Gastro-esophageal reflux disease without esophagitis: Secondary | ICD-10-CM

## 2023-04-24 DIAGNOSIS — E785 Hyperlipidemia, unspecified: Secondary | ICD-10-CM

## 2023-04-24 DIAGNOSIS — Z23 Encounter for immunization: Secondary | ICD-10-CM

## 2023-04-24 DIAGNOSIS — E1169 Type 2 diabetes mellitus with other specified complication: Secondary | ICD-10-CM

## 2023-04-24 DIAGNOSIS — F419 Anxiety disorder, unspecified: Secondary | ICD-10-CM

## 2023-04-24 DIAGNOSIS — Z7689 Persons encountering health services in other specified circumstances: Secondary | ICD-10-CM

## 2023-04-24 DIAGNOSIS — R7989 Other specified abnormal findings of blood chemistry: Secondary | ICD-10-CM

## 2023-04-24 DIAGNOSIS — G4733 Obstructive sleep apnea (adult) (pediatric): Secondary | ICD-10-CM | POA: Diagnosis not present

## 2023-04-24 DIAGNOSIS — E119 Type 2 diabetes mellitus without complications: Secondary | ICD-10-CM | POA: Diagnosis not present

## 2023-04-24 DIAGNOSIS — R030 Elevated blood-pressure reading, without diagnosis of hypertension: Secondary | ICD-10-CM

## 2023-04-24 MED ORDER — ROSUVASTATIN CALCIUM 10 MG PO TABS: 5.0000 mg | ORAL_TABLET | Freq: Every day | ORAL | Status: DC

## 2023-04-24 MED ORDER — LISINOPRIL 5 MG PO TABS: 2.5000 mg | ORAL_TABLET | Freq: Two times a day (BID) | ORAL | Status: DC

## 2023-04-24 NOTE — Patient Instructions (Addendum)
VISIT SUMMARY:  You came in today for a routine follow-up and to discuss recent episodes of palpitations. We reviewed your current management for diabetes, hyperlipidemia, hypertension, and sleep apnea. You also shared your experiences with anxiety management and your desire for consistent care. Overall, your conditions are well-controlled, and we discussed some additional tests and adjustments to ensure your continued health.  YOUR PLAN:  -DIABETES MELLITUS TYPE 2: Diabetes Mellitus Type 2 is a condition where your body does not use insulin properly, leading to high blood sugar levels. Your diabetes is well-controlled with your current medication, metformin. We will continue with your current dosage and check your A1c and metabolic panel regularly to monitor your condition.  -HYPERLIPIDEMIA: Hyperlipidemia is a condition with high levels of fats (lipids) in your blood. You are managing this with ezetimibe and a low dose of rosuvastatin due to a statin allergy. We will continue with your current medications and check your lipid levels regularly.  -HYPERTENSION: Hypertension is high blood pressure. Your blood pressure is well-controlled with lisinopril, which you take in split doses. This medication also helps protect your kidneys, especially important with diabetes. We will continue with your current regimen.  -PALPITATIONS: Palpitations are feelings of having a fast-beating, fluttering, or pounding heart. Your episodes are brief and infrequent. We will check your thyroid levels, metabolic panel, magnesium levels, and complete blood count (CBC) to rule out any underlying issues. If the palpitations persist, we may consider an EKG and heart monitor.  -SLEEP APNEA: Sleep apnea is a condition where you stop breathing briefly during sleep. You are using a CPAP machine nightly, which is good for reducing heart strain. For your nasal congestion, you can use a saline nasal spray.  -GENERAL HEALTH  MAINTENANCE: You are following recommended therapies for your conditions. Regular follow-ups and lab checks are necessary to monitor your health. We will schedule a physical exam in May and follow up with lab results through MyChart.  INSTRUCTIONS:  Please follow up with the lab tests we discussed: A1c, metabolic panel, lipid panel, thyroid levels, magnesium levels, and CBC. We will review the results and make any necessary adjustments to your medications. Schedule your physical exam in May and continue to use MyChart for updates and communication.  I would recommend both the RSV vaccine (for hx of diabetes and age over 65) and the Pnuemonia vaccine once you turn 63 years old

## 2023-04-25 LAB — CMP14+EGFR
ALT: 18 [IU]/L (ref 0–44)
AST: 22 [IU]/L (ref 0–40)
Albumin: 4.6 g/dL (ref 3.9–4.9)
Alkaline Phosphatase: 56 [IU]/L (ref 44–121)
BUN/Creatinine Ratio: 14 (ref 10–24)
BUN: 18 mg/dL (ref 8–27)
Bilirubin Total: 0.4 mg/dL (ref 0.0–1.2)
CO2: 25 mmol/L (ref 20–29)
Calcium: 10.4 mg/dL — ABNORMAL HIGH (ref 8.6–10.2)
Chloride: 100 mmol/L (ref 96–106)
Creatinine, Ser: 1.32 mg/dL — ABNORMAL HIGH (ref 0.76–1.27)
Globulin, Total: 2.9 g/dL (ref 1.5–4.5)
Glucose: 88 mg/dL (ref 70–99)
Potassium: 4.7 mmol/L (ref 3.5–5.2)
Sodium: 141 mmol/L (ref 134–144)
Total Protein: 7.5 g/dL (ref 6.0–8.5)
eGFR: 61 mL/min/{1.73_m2} (ref >59)

## 2023-04-25 LAB — LIPID PANEL
Chol/HDL Ratio: 3 {ratio} (ref 0.0–5.0)
Cholesterol, Total: 152 mg/dL (ref 100–199)
HDL: 50 mg/dL (ref >39)
LDL Chol Calc (NIH): 60 mg/dL (ref 0–99)
Triglycerides: 263 mg/dL — ABNORMAL HIGH (ref 0–149)
VLDL Cholesterol Cal: 42 mg/dL — ABNORMAL HIGH (ref 5–40)

## 2023-04-25 LAB — CBC
Hematocrit: 43.1 % (ref 37.5–51.0)
Hemoglobin: 14.7 g/dL (ref 13.0–17.7)
MCH: 32.8 pg (ref 26.6–33.0)
MCHC: 34.1 g/dL (ref 31.5–35.7)
MCV: 96 fL (ref 79–97)
Platelets: 334 10*3/uL (ref 150–450)
RBC: 4.48 x10E6/uL (ref 4.14–5.80)
RDW: 12.2 % (ref 11.6–15.4)
WBC: 10.1 10*3/uL (ref 3.4–10.8)

## 2023-04-25 LAB — HEMOGLOBIN A1C
Est. average glucose Bld gHb Est-mCnc: 148 mg/dL
Hgb A1c MFr Bld: 6.8 % — ABNORMAL HIGH (ref 4.8–5.6)

## 2023-04-25 LAB — TSH+T4F+T3FREE
Free T4: 0.92 ng/dL (ref 0.82–1.77)
T3, Free: 3 pg/mL (ref 2.0–4.4)
TSH: 2.86 u[IU]/mL (ref 0.450–4.500)

## 2023-04-25 LAB — MAGNESIUM: Magnesium: 2 mg/dL (ref 1.6–2.3)

## 2023-04-25 NOTE — Assessment & Plan Note (Signed)
Uses CPAP nightly with good compliance. Reports nasal congestion due to allergies. Discussed benefits of CPAP in reducing heart strain and use of saline nasal spray for congestion. - chronic problem  - Continue CPAP use - Use saline nasal spray for congestion

## 2023-04-25 NOTE — Assessment & Plan Note (Signed)
On ezetimibe and rosuvastatin 5 mg daily due to statin allergy. Reports chest pains with higher doses of rosuvastatin. Discussed need for regular lipid panel checks and option to use [facility] for fasting lipid panel if needed. - chronic problem  - Order lipid panel - Continue ezetimibe - Continue rosuvastatin 5 mg daily

## 2023-04-25 NOTE — Assessment & Plan Note (Addendum)
Well-controlled with last A1c of 6.3 in October. On metformin 1000 mg at lunch and 500 mg at bedtime. Discussed importance of regular A1c checks every six months. - chronic, stable  - Order A1c - Order metabolic panel - Continue metformin 1000 mg at lunch and 500 mg at bedtime Continue lisinopril 5 mg daily, split into half doses at lunch and night

## 2023-04-25 NOTE — Assessment & Plan Note (Signed)
Intermittent, brief, and infrequent. Differential includes electrolyte abnormalities, anxiety, thyroid abnormalities, or cardiac issues. Discussed potential need for EKG and heart monitor if symptoms persist. - Order thyroid levels - Order metabolic panel - Order magnesium levels - Order CBC - Consider EKG and heart monitor if symptoms persist

## 2023-04-26 LAB — MICROALBUMIN / CREATININE URINE RATIO
Creatinine, Urine: 122.4 mg/dL
Microalb/Creat Ratio: <2 mg/g{creat} (ref 0–29)
Microalbumin, Urine: <3 ug/mL

## 2023-04-26 LAB — SPECIMEN STATUS REPORT

## 2023-06-08 ENCOUNTER — Other Ambulatory Visit: Payer: Self-pay | Admitting: Family Medicine

## 2023-06-08 DIAGNOSIS — E119 Type 2 diabetes mellitus without complications: Secondary | ICD-10-CM

## 2023-09-07 ENCOUNTER — Other Ambulatory Visit: Payer: Self-pay | Admitting: Family Medicine

## 2023-09-23 ENCOUNTER — Other Ambulatory Visit: Payer: Self-pay | Admitting: Family Medicine

## 2023-09-23 DIAGNOSIS — B009 Herpesviral infection, unspecified: Secondary | ICD-10-CM

## 2023-10-16 ENCOUNTER — Encounter: Payer: Self-pay | Admitting: Family Medicine

## 2023-11-13 ENCOUNTER — Ambulatory Visit: Payer: Self-pay | Admitting: Family Medicine

## 2023-11-13 ENCOUNTER — Encounter: Payer: Self-pay | Admitting: Family Medicine

## 2023-11-13 VITALS — BP 131/70 | HR 65 | Ht 72.0 in | Wt 245.0 lb

## 2023-11-13 DIAGNOSIS — K219 Gastro-esophageal reflux disease without esophagitis: Secondary | ICD-10-CM

## 2023-11-13 DIAGNOSIS — E66811 Obesity, class 1: Secondary | ICD-10-CM

## 2023-11-13 DIAGNOSIS — Z131 Encounter for screening for diabetes mellitus: Secondary | ICD-10-CM

## 2023-11-13 DIAGNOSIS — Z7984 Long term (current) use of oral hypoglycemic drugs: Secondary | ICD-10-CM

## 2023-11-13 DIAGNOSIS — Z13220 Encounter for screening for lipoid disorders: Secondary | ICD-10-CM

## 2023-11-13 DIAGNOSIS — Z130 Encounter for screening for diseases of the blood and blood-forming organs and certain disorders involving the immune mechanism: Secondary | ICD-10-CM

## 2023-11-13 DIAGNOSIS — G4733 Obstructive sleep apnea (adult) (pediatric): Secondary | ICD-10-CM | POA: Diagnosis not present

## 2023-11-13 DIAGNOSIS — E119 Type 2 diabetes mellitus without complications: Secondary | ICD-10-CM

## 2023-11-13 DIAGNOSIS — E785 Hyperlipidemia, unspecified: Secondary | ICD-10-CM

## 2023-11-13 DIAGNOSIS — Z0001 Encounter for general adult medical examination with abnormal findings: Secondary | ICD-10-CM

## 2023-11-13 DIAGNOSIS — E781 Pure hyperglyceridemia: Secondary | ICD-10-CM

## 2023-11-13 DIAGNOSIS — Z0000 Encounter for general adult medical examination without abnormal findings: Secondary | ICD-10-CM | POA: Insufficient documentation

## 2023-11-13 DIAGNOSIS — E1169 Type 2 diabetes mellitus with other specified complication: Secondary | ICD-10-CM

## 2023-11-13 NOTE — Progress Notes (Signed)
 Complete physical exam   Patient: Francisco Griffin   DOB: 03-Feb-1961   63 y.o. Male  MRN: [id number] Visit Date: 11/13/2023  Today's healthcare provider: Mimi Alt, MD   Chief Complaint  Patient presents with   Annual Exam   Subjective    Francisco Griffin is a 63 y.o. male who presents today for a complete physical exam.    He does not have additional problems to discuss today.   Discussed the use of AI scribe software for clinical note transcription with the patient, who gave verbal consent to proceed.  History of Present Illness Francisco Griffin is a 63 year old male who presents for an annual physical exam.  He has a history of hyperlipidemia, controlled type two diabetes, osteoarthritis, degenerative meniscus of the right knee, anxiety, hypertriglyceridemia, GERD, obstructive sleep apnea, and allergic rhinitis. His exercise routine has been inconsistent, although he occasionally participates in a walking challenge with his wife. He is trying to eat more salads and watch his intake, with his weight stable around 240-246 pounds.  He experiences soreness in his knuckles due to osteoarthritis and a degenerative meniscus in the right knee. He has consulted with [facility] but has not scheduled surgery due to concerns about recovery time. He has changed his shoes to more comfortable ones that do not impinge or apply pressure.  His type two diabetes is controlled, but managing it has become more challenging as he ages. He is interested in discussing potential medication options in the future. He is making efforts to drink more water and walk regularly.  He and his wife have been traveling frequently to [place]  to assist with his wife's father's care. Despite these challenges, he feels mentally stable and in a good place.     Past Medical History:  Diagnosis Date   Allergy    Anxiety    Arthritis    Cataract    Depression    Diabetes mellitus without  complication (HCC)    GERD (gastroesophageal reflux disease)    Hyperlipidemia    Hypertension    Sleep apnea    cpap   Past Surgical History:  Procedure Laterality Date   COLONOSCOPY     FRACTURE SURGERY     5th grade broke wrist   WISDOM TOOTH EXTRACTION     Social History   Socioeconomic History   Marital status: Married    Spouse name: Not on file   Number of children: Not on file   Years of education: Not on file   Highest education level: Bachelor's degree (e.g., BA, AB, BS)  Occupational History   Not on file  Tobacco Use   Smoking status: Never    Passive exposure: Never   Smokeless tobacco: Never  Vaping Use   Vaping status: Never Used  Substance and Sexual Activity   Alcohol use: Yes    Alcohol/week: 2.0 standard drinks of alcohol    Types: 1 Cans of beer, 1 Shots of liquor per week   Drug use: No   Sexual activity: Yes    Birth control/protection: None    Comment: Sex with my wife; not outside of marriage  Other Topics Concern   Not on file  Social History Narrative   Not on file   Social Drivers of Health   Financial Resource Strain: Low Risk  (11/09/2023)   Overall Financial Resource Strain (CARDIA)    Difficulty of Paying Living Expenses: Not very hard  Food Insecurity: No  Food Insecurity (11/09/2023)   Hunger Vital Sign    Worried About Running Out of Food in the Last Year: Never true    Ran Out of Food in the Last Year: Never true  Transportation Needs: No Transportation Needs (11/09/2023)   PRAPARE - Administrator, Civil Service (Medical): No    Lack of Transportation (Non-Medical): No  Physical Activity: Insufficiently Active (11/09/2023)   Exercise Vital Sign    Days of Exercise per Week: 2 days    Minutes of Exercise per Session: 20 min  Stress: No Stress Concern Present (11/09/2023)   [employer] of Occupational Health - Occupational Stress Questionnaire    Feeling of Stress: Only a little  Social Connections: Socially  Integrated (11/09/2023)   Social Connection and Isolation Panel    Frequency of Communication with Friends and Family: More than three times a week    Frequency of Social Gatherings with Friends and Family: Three times a week    Attends Religious Services: More than 4 times per year    Active Member of Clubs or Organizations: Yes    Attends Banker Meetings: More than 4 times per year    Marital Status: Married  Catering manager Violence: Not At Risk (11/13/2023)   Humiliation, Afraid, Rape, and Kick questionnaire    Fear of Current or Ex-Partner: No    Emotionally Abused: No    Physically Abused: No    Sexually Abused: No   Family Status  Relation Name Status   Mother Cassie Henkels Deceased   Father Baraka Klatt Barcomb Deceased   Sister Bobie Burton Alive   Mat Aunt  (Not Specified)   Ala Alice  (Not Specified)   MGM  Deceased   MGF Boby Bury Deceased   PGM Spiros Greenfeld Deceased   PGF Carmelita Ching Sr Deceased   Neg Hx  (Not Specified)  No partnership data on file   Family History  Problem Relation Age of Onset   Cancer Mother    Heart disease Father    Pulmonary fibrosis Father    Hyperlipidemia Father    Hypertension Father    Diabetes Father    Thyroid disease Father    Hyperlipidemia Sister    Asthma Sister    Alcoholism Maternal Aunt    Diabetes Paternal Uncle    Heart disease Paternal Uncle    Esophageal cancer Maternal Grandfather    Cancer Maternal Grandfather    Heart disease Paternal Grandmother    Diabetes Paternal Grandfather    Heart disease Paternal Grandfather    Colon cancer Neg Hx    Colon polyps Neg Hx    Stomach cancer Neg Hx    Rectal cancer Neg Hx    Allergies  Allergen Reactions   Cat Dander    Lipitor [Atorvastatin] Other (See Comments)    Muscle aches across his chest     Medications: Outpatient Medications Prior to Visit  Medication Sig   aspirin EC 81 MG tablet Take 81 mg by mouth daily.   b  complex vitamins capsule Take 1 capsule by mouth daily.   Chromium-Cinnamon 100-500 MCG-MG CAPS Take 1 capsule by mouth daily.   ezetimibe  (ZETIA ) 10 MG tablet TAKE 1 TABLET BY MOUTH EVERY DAY   Ibuprofen (MOTRIN PO) Take by mouth.   lisinopril  (ZESTRIL ) 5 MG tablet TAKE 1 TABLET BY MOUTH EVERYDAY AT BEDTIME   metFORMIN  (GLUCOPHAGE ) 1000 MG tablet TAKE 1 TABLET (1,000 MG TOTAL) BY MOUTH DAILY  WITH LUNCH AND 0.5 TABLETS (500 MG TOTAL) AT BEDTIME.   Naproxen Sodium (ALEVE PO) Take by mouth.   rosuvastatin  (CRESTOR ) 10 MG tablet Take 0.5 tablets (5 mg total) by mouth daily.   valACYclovir  (VALTREX ) 500 MG tablet TAKE 1 TABLET (500 MG TOTAL) BY MOUTH DAILY.   No facility-administered medications prior to visit.    Review of Systems  Last CBC Lab Results  Component Value Date   WBC 10.1 04/24/2023   HGB 14.7 04/24/2023   HCT 43.1 04/24/2023   MCV 96 04/24/2023   MCH 32.8 04/24/2023   RDW 12.2 04/24/2023   PLT 334 04/24/2023   Last metabolic panel Lab Results  Component Value Date   GLUCOSE 88 04/24/2023   NA 141 04/24/2023   K 4.7 04/24/2023   CL 100 04/24/2023   CO2 25 04/24/2023   BUN 18 04/24/2023   CREATININE 1.32 (H) 04/24/2023   EGFR 61 04/24/2023   CALCIUM  10.4 (H) 04/24/2023   PROT 7.5 04/24/2023   ALBUMIN 4.6 04/24/2023   LABGLOB 2.9 04/24/2023   AGRATIO 2.0 09/05/2021   BILITOT 0.4 04/24/2023   ALKPHOS 56 04/24/2023   AST 22 04/24/2023   ALT 18 04/24/2023   Last lipids Lab Results  Component Value Date   CHOL 152 04/24/2023   HDL 50 04/24/2023   LDLCALC 60 04/24/2023   LDLDIRECT 93 10/27/2020   TRIG 263 (H) 04/24/2023   CHOLHDL 3.0 04/24/2023   Last hemoglobin A1c Lab Results  Component Value Date   HGBA1C 6.8 (H) 04/24/2023   Last thyroid functions Lab Results  Component Value Date   TSH 2.860 04/24/2023   Last vitamin D No results found for: 25OHVITD2, 25OHVITD3, VD25OH Last vitamin B12 and Folate No results found for: VITAMINB12,  FOLATE     Objective    BP 131/70   Pulse 65   Ht 6' (1.829 m)   Wt 245 lb (111.1 kg)   SpO2 99%   BMI 33.23 kg/m   BP Readings from Last 3 Encounters:  11/13/23 131/70  04/24/23 126/66  03/28/22 136/74   Wt Readings from Last 3 Encounters:  11/13/23 245 lb (111.1 kg)  04/24/23 244 lb (110.7 kg)  03/28/22 246 lb (111.6 kg)        Physical Exam Constitutional:      General: He is not in acute distress.    Appearance: Normal appearance. He is not ill-appearing.  HENT:     Mouth/Throat:     Mouth: Mucous membranes are moist.   Eyes:     General: No scleral icterus.       Right eye: No discharge.        Left eye: No discharge.     Extraocular Movements: Extraocular movements intact.     Conjunctiva/sclera: Conjunctivae normal.     Pupils: Pupils are equal, round, and reactive to light.    Cardiovascular:     Rate and Rhythm: Normal rate and regular rhythm.     Heart sounds: Normal heart sounds.     Comments: Trace LLE edema  Pulmonary:     Effort: Pulmonary effort is normal.     Breath sounds: Normal breath sounds.  Abdominal:     General: Bowel sounds are normal. There is no distension.     Palpations: Abdomen is soft.     Tenderness: There is no abdominal tenderness.   Musculoskeletal:        General: No deformity or signs of injury. Normal range of motion.  Cervical back: Normal range of motion and neck supple. No tenderness.     Right lower leg: No edema.     Left lower leg: Edema present.  Lymphadenopathy:     Cervical: No cervical adenopathy.   Neurological:     Mental Status: He is alert and oriented to person, place, and time.     Cranial Nerves: No cranial nerve deficit.     Motor: No weakness.     Gait: Gait normal.   Psychiatric:        Mood and Affect: Mood normal.        Behavior: Behavior normal.       Last depression screening scores    11/13/2023   10:19 AM 04/24/2023    2:17 PM 03/28/2022    8:45 AM  PHQ 2/9 Scores   PHQ - 2 Score 0 1 1  PHQ- 9 Score 1 3 3     Last fall risk screening    04/24/2023    2:18 PM  Fall Risk   Falls in the past year? 1  Number falls in past yr: 0  Injury with Fall? 0    Last Audit-C alcohol use screening    11/09/2023    2:26 PM  Alcohol Use Disorder Test (AUDIT)  1. How often do you have a drink containing alcohol? 1  2. How many drinks containing alcohol do you have on a typical day when you are drinking? 0  3. How often do you have six or more drinks on one occasion? 0  AUDIT-C Score 1      Patient-reported   A score of 3 or more in women, and 4 or more in men indicates increased risk for alcohol abuse, EXCEPT if all of the points are from question 1   No results found for any visits on 11/13/23.  Assessment & Plan    Routine Health Maintenance and Physical Exam  Immunization History  Administered Date(s) Administered   Influenza, Quadrivalent, Recombinant, Inj, Pf 02/22/2022   Influenza, Seasonal, Injecte, Preservative Fre 04/24/2023   Influenza,inj,Quad PF,6+ Mos 05/07/2018, 01/09/2019   Influenza-Unspecified 02/02/2021   PFIZER Comirnaty(Gray Top)Covid-19 Tri-Sucrose Vaccine 10/02/2020   PFIZER(Purple Top)SARS-COV-2 Vaccination 04/24/2020, 02/22/2022   PNEUMOCOCCAL CONJUGATE-20 10/02/2020   Pfizer Covid-19 Vaccine Bivalent Booster 8yrs & up 02/15/2021   Pneumococcal Polysaccharide-23 01/11/2017   Tdap 01/11/2017   Zoster Recombinant(Shingrix) 07/29/2017, 10/11/2017    Health Maintenance  Topic Date Due   FOOT EXAM  11/08/2022   HEMOGLOBIN A1C  10/22/2023   OPHTHALMOLOGY EXAM  11/17/2023   INFLUENZA VACCINE  12/28/2023   Diabetic kidney evaluation - eGFR measurement  04/23/2024   Diabetic kidney evaluation - Urine ACR  04/23/2024   DTaP/Tdap/Td (2 - Td or Tdap) 01/12/2027   Colonoscopy  11/12/2031   Pneumococcal Vaccine 25-52 Years old  Completed   COVID-19 Vaccine  Completed   Hepatitis C Screening  Completed   HIV Screening   Completed   Zoster Vaccines- Shingrix  Completed   HPV VACCINES  Aged Out   Meningococcal B Vaccine  Aged Out    Problem List Items Addressed This Visit       Respiratory   Obstructive sleep apnea (Chronic)     Digestive   GERD (gastroesophageal reflux disease) (Chronic)     Endocrine   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 Blood pressure is well controlled at 131/70 mmHg. He has hyperlipidemia, well-controlled type 2 diabetes, osteoarthritis, degenerative meniscus of the right knee, anxiety, hypertriglyceridemia, GERD, obstructive sleep apnea, and allergic rhinitis. He reports stable weight, increased salad consumption, and dietary monitoring. Frequent travel to care for his wife's father has impacted his exercise routine. - Recommend 150 minutes of exercise per week - Order updated A1c, lipid panel, CBC, and CMP - Perform diabetes foot exam      Other Visit Diagnoses       Screening for deficiency anemia       Relevant Orders   CBC     Screening for diabetes mellitus       Relevant Orders   Hemoglobin A1c     Screening for lipid disorders           Assessment & Plan   Type 2 Diabetes Mellitus His type 2 diabetes is well controlled. He is interested in discussing potential medication options such as Trulicity in the future, understanding the benefits and potential side effects. - Schedule follow-up appointment to discuss diabetes management options, including Trulicity  General Health Maintenance He is advised to  maintain regular physical activity and a healthy diet. - Encourage regular physical activity - Encourage healthy dietary habits       No follow-ups on file.       Mimi Alt, MD  [facility] [phone] (phone) [phone] (fax)  [facility]

## 2023-11-13 NOTE — Assessment & Plan Note (Signed)
 Annual Physical Examination He presents for an annual physical examination. Blood pressure is well controlled at 131/70 mmHg. He has hyperlipidemia, well-controlled type 2 diabetes, osteoarthritis, degenerative meniscus of the right knee, anxiety, hypertriglyceridemia, GERD, obstructive sleep apnea, and allergic rhinitis. He reports stable weight, increased salad consumption, and dietary monitoring. Frequent travel to care for his wife's father has impacted his exercise routine. - Recommend 150 minutes of exercise per week - Order updated A1c, lipid panel, CBC, and CMP - Perform diabetes foot exam

## 2023-11-14 LAB — CBC
Hematocrit: 44.3 % (ref 37.5–51.0)
Hemoglobin: 14.4 g/dL (ref 13.0–17.7)
MCH: 32 pg (ref 26.6–33.0)
MCHC: 32.5 g/dL (ref 31.5–35.7)
MCV: 98 fL — ABNORMAL HIGH (ref 79–97)
Platelets: 319 10*3/uL (ref 150–450)
RBC: 4.5 x10E6/uL (ref 4.14–5.80)
RDW: 12.4 % (ref 11.6–15.4)
WBC: 9 10*3/uL (ref 3.4–10.8)

## 2023-11-14 LAB — CMP14+EGFR
ALT: 23 IU/L (ref 0–44)
AST: 21 IU/L (ref 0–40)
Albumin: 4.8 g/dL (ref 3.9–4.9)
Alkaline Phosphatase: 49 IU/L (ref 44–121)
BUN/Creatinine Ratio: 11 (ref 10–24)
BUN: 12 mg/dL (ref 8–27)
Bilirubin Total: 0.7 mg/dL (ref 0.0–1.2)
CO2: 22 mmol/L (ref 20–29)
Calcium: 10.1 mg/dL (ref 8.6–10.2)
Chloride: 101 mmol/L (ref 96–106)
Creatinine, Ser: 1.1 mg/dL (ref 0.76–1.27)
Globulin, Total: 2.6 g/dL (ref 1.5–4.5)
Glucose: 122 mg/dL — ABNORMAL HIGH (ref 70–99)
Potassium: 4.8 mmol/L (ref 3.5–5.2)
Sodium: 141 mmol/L (ref 134–144)
Total Protein: 7.4 g/dL (ref 6.0–8.5)
eGFR: 75 mL/min/{1.73_m2} (ref >59)

## 2023-11-14 LAB — LIPID PANEL
Chol/HDL Ratio: 3 ratio (ref 0.0–5.0)
Cholesterol, Total: 146 mg/dL (ref 100–199)
HDL: 48 mg/dL (ref >39)
LDL Chol Calc (NIH): 68 mg/dL (ref 0–99)
Triglycerides: 176 mg/dL — ABNORMAL HIGH (ref 0–149)
VLDL Cholesterol Cal: 30 mg/dL (ref 5–40)

## 2023-11-14 LAB — HEMOGLOBIN A1C
Est. average glucose Bld gHb Est-mCnc: 148 mg/dL
Hgb A1c MFr Bld: 6.8 % — ABNORMAL HIGH (ref 4.8–5.6)

## 2023-11-22 ENCOUNTER — Ambulatory Visit: Payer: Self-pay | Admitting: Family Medicine

## 2023-11-23 LAB — HM DIABETES EYE EXAM

## 2023-11-29 ENCOUNTER — Other Ambulatory Visit: Payer: Self-pay | Admitting: Family Medicine

## 2023-11-29 DIAGNOSIS — E1169 Type 2 diabetes mellitus with other specified complication: Secondary | ICD-10-CM

## 2023-12-11 ENCOUNTER — Ambulatory Visit: Admitting: Family Medicine

## 2023-12-11 ENCOUNTER — Encounter: Payer: Self-pay | Admitting: Family Medicine

## 2023-12-11 VITALS — BP 124/73 | HR 65 | Ht 72.0 in | Wt 239.0 lb

## 2023-12-11 DIAGNOSIS — E66811 Obesity, class 1: Secondary | ICD-10-CM

## 2023-12-11 DIAGNOSIS — E1169 Type 2 diabetes mellitus with other specified complication: Secondary | ICD-10-CM

## 2023-12-11 DIAGNOSIS — E119 Type 2 diabetes mellitus without complications: Secondary | ICD-10-CM

## 2023-12-11 DIAGNOSIS — E785 Hyperlipidemia, unspecified: Secondary | ICD-10-CM

## 2023-12-11 DIAGNOSIS — Z7984 Long term (current) use of oral hypoglycemic drugs: Secondary | ICD-10-CM

## 2023-12-11 NOTE — Progress Notes (Signed)
 Established patient visit   Patient: Francisco Griffin   DOB: Oct 11, 1960   63 y.o. Male  MRN: [id number] Visit Date: 12/11/2023  Today's healthcare provider: Rockie Agent, MD   Chief Complaint  Patient presents with   Diabetes   Subjective       Discussed the use of AI scribe software for clinical note transcription with the patient, who gave verbal consent to proceed.  History of Present Illness Francisco Griffin is a 63 year old male with diabetes who presents for a diabetes management follow-up.  He has been actively managing his weight and diet, resulting in a six-pound weight loss over the past month. His dietary regimen includes fasting in the morning and consuming meals with controlled portions of meat, vegetables, and fruit. He has reduced his intake of bread and sweets, opting for healthier snacks like [place] pepperonis and yogurt. He reports feeling better with reduced inflammation in his knees.  He brought in results from a [employer] screening conducted at his [facility]. The results showed a BMI of 32.5, elevated blood pressure at 143/75 on the test date, but normal today at 124/73. He had mild carotid artery disease on the left with a PSV of 11-125, while the right side was normal. His glucose level was 112, and his total cholesterol was 129 with HDL at 44 and LDL at 42. Triglycerides were elevated at 215. His coronary heart disease risk score was 34, and his stroke risk was 36. He has a waist circumference of 44 inches.  He is currently walking for 30 minutes three times a week and is considering increasing his physical activity. He has reduced his soda intake and is drinking more water.  He is concerned about the side effects of certain diabetes medications, having previously experienced gastrointestinal issues with Ozempic . He is currently taking metformin  and Zetia  for his diabetes and cholesterol management.     Past Medical History:  Diagnosis Date    Allergy    Anxiety    Arthritis    Cataract    Depression    Diabetes mellitus without complication (HCC)    GERD (gastroesophageal reflux disease)    Hyperlipidemia    Hypertension    Sleep apnea    cpap    Medications: Outpatient Medications Prior to Visit  Medication Sig   aspirin EC 81 MG tablet Take 81 mg by mouth daily.   b complex vitamins capsule Take 1 capsule by mouth daily.   Chromium-Cinnamon 100-500 MCG-MG CAPS Take 1 capsule by mouth daily.   ezetimibe  (ZETIA ) 10 MG tablet TAKE 1 TABLET BY MOUTH EVERY DAY   Ibuprofen (MOTRIN PO) Take by mouth.   lisinopril  (ZESTRIL ) 5 MG tablet TAKE 1 TABLET BY MOUTH EVERYDAY AT BEDTIME   metFORMIN  (GLUCOPHAGE ) 1000 MG tablet TAKE 1 TABLET (1,000 MG TOTAL) BY MOUTH DAILY WITH LUNCH AND 0.5 TABLETS (500 MG TOTAL) AT BEDTIME.   rosuvastatin  (CRESTOR ) 10 MG tablet TAKE 1/2 TABLET BY MOUTH DAILY   valACYclovir  (VALTREX ) 500 MG tablet TAKE 1 TABLET (500 MG TOTAL) BY MOUTH DAILY.   No facility-administered medications prior to visit.    Review of Systems  Last metabolic panel Lab Results  Component Value Date   GLUCOSE 122 (H) 11/13/2023   NA 141 11/13/2023   K 4.8 11/13/2023   CL 101 11/13/2023   CO2 22 11/13/2023   BUN 12 11/13/2023   CREATININE 1.10 11/13/2023   EGFR 75 11/13/2023   CALCIUM  10.1 11/13/2023  PROT 7.4 11/13/2023   ALBUMIN 4.8 11/13/2023   LABGLOB 2.6 11/13/2023   AGRATIO 2.0 09/05/2021   BILITOT 0.7 11/13/2023   ALKPHOS 49 11/13/2023   AST 21 11/13/2023   ALT 23 11/13/2023   Last lipids Lab Results  Component Value Date   CHOL 146 11/13/2023   HDL 48 11/13/2023   LDLCALC 68 11/13/2023   LDLDIRECT 93 10/27/2020   TRIG 176 (H) 11/13/2023   CHOLHDL 3.0 11/13/2023   Last hemoglobin A1c Lab Results  Component Value Date   HGBA1C 6.8 (H) 11/13/2023        Objective    BP 124/73   Pulse 65   Ht 6' (1.829 m)   Wt 239 lb (108.4 kg)   SpO2 99%   BMI 32.41 kg/m   BP Readings from Last 3  Encounters:  12/11/23 124/73  11/13/23 131/70  04/24/23 126/66   Wt Readings from Last 3 Encounters:  12/11/23 239 lb (108.4 kg)  11/13/23 245 lb (111.1 kg)  04/24/23 244 lb (110.7 kg)        Physical Exam Vitals reviewed.  Constitutional:      General: He is not in acute distress.    Appearance: Normal appearance. He is not ill-appearing.  Cardiovascular:     Rate and Rhythm: Normal rate and regular rhythm.     Pulses:          Dorsalis pedis pulses are 2+ on the right side and 2+ on the left side.       Posterior tibial pulses are 2+ on the right side and 2+ on the left side.  Pulmonary:     Effort: Pulmonary effort is normal. No respiratory distress.     Breath sounds: No wheezing, rhonchi or rales.  Musculoskeletal:     Right foot: Normal range of motion. No deformity or bunion.     Left foot: Normal range of motion. No deformity or bunion.  Feet:     Right foot:     Protective Sensation: 6 sites tested.  6 sites sensed.     Skin integrity: Skin integrity normal. No erythema.     Toenail Condition: Right toenails are normal.     Left foot:     Protective Sensation: 6 sites tested.  6 sites sensed.     Skin integrity: Skin integrity normal. No erythema.     Toenail Condition: Left toenails are normal.  Neurological:     Mental Status: He is alert and oriented to person, place, and time.  Psychiatric:        Mood and Affect: Mood normal.        Behavior: Behavior normal.       No results found for any visits on 12/11/23.  Assessment & Plan     Problem List Items Addressed This Visit       Endocrine   Hyperlipidemia due to type 2 diabetes mellitus (HCC) (Chronic)   Hyperlipidemia Chronic  Total cholesterol is 129, HDL is 44, LDL is 42, and triglycerides are elevated at 15. He is on ezetimibe  (Zetia ) for triglyceride management and is reducing intake of sodas and starches to improve lipid profile. - Continue ezetimibe  (Zetia ). - Focus on dietary  modifications to reduce triglycerides. - Reassess lipid panel in October.        Controlled type 2 diabetes mellitus without complication, without long-term current use of insulin (HCC) - Primary (Chronic)   Well-controlled with last A1c of 6.8 11/13/23. On metformin  1000 mg  at lunch and 500 mg at bedtime. Discussed importance of regular A1c checks every six months. - chronic, stable  - Continue metformin  1000 mg at lunch and 500 mg at bedtime - Continue lisinopril  5 mg daily, split into half doses at lunch and night -DM foot exam updated - Consider starting Trulicity if lifestyle modifications do not achieve desired outcomes.        Other   Obesity (BMI 30.0-34.9) (Chronic)   Chronic  BMI is 32.4. He has lost 6 pounds since the last visit and is following a structured diet plan. He aims to lose 20 pounds by Christmas. He is walking 30 minutes three times a week and considering adding resistance training. He is cautious about weight loss medications and prefers lifestyle changes, aware of potential weight regain after discontinuation of medications like Trulicity. - Continue current diet and exercise regimen. - Consider adding resistance training to exercise routine. - Reassess weight loss progress in October.        Assessment & Plan   HM  Discussed the importance of waist circumference measurement as an additional health indicator. He is aware that BMI is not fully inclusive of all health factors. - Encourage continued lifestyle modifications. - Measure waist circumference regularly as an additional health indicator.  Follow-up Scheduled for a follow-up appointment in October to reassess diabetes management, weight loss progress, and lipid levels. He is open to starting Trulicity if lifestyle modifications do not achieve desired outcomes by then. - Schedule follow-up appointment in October. - Contact provider if interested in starting Trulicity before the next  appointment.     Return in about 3 months (around 03/12/2024) for Weight MGMT, DM.         Rockie Agent, MD  [facility] [phone] (phone) [phone] (fax)  [facility]

## 2023-12-11 NOTE — Assessment & Plan Note (Addendum)
 Hyperlipidemia Chronic  Total cholesterol is 129, HDL is 44, LDL is 42, and triglycerides are elevated at 15. He is on ezetimibe  (Zetia ) for triglyceride management and is reducing intake of sodas and starches to improve lipid profile. - Continue ezetimibe  (Zetia ). - Focus on dietary modifications to reduce triglycerides. - Reassess lipid panel in October.

## 2023-12-11 NOTE — Assessment & Plan Note (Addendum)
 Well-controlled with last A1c of 6.8 11/13/23. On metformin  1000 mg at lunch and 500 mg at bedtime. Discussed importance of regular A1c checks every six months. - chronic, stable  - Continue metformin  1000 mg at lunch and 500 mg at bedtime - Continue lisinopril  5 mg daily, split into half doses at lunch and night -DM foot exam updated - Consider starting Trulicity if lifestyle modifications do not achieve desired outcomes.

## 2023-12-11 NOTE — Assessment & Plan Note (Signed)
 Chronic  BMI is 32.4. He has lost 6 pounds since the last visit and is following a structured diet plan. He aims to lose 20 pounds by Christmas. He is walking 30 minutes three times a week and considering adding resistance training. He is cautious about weight loss medications and prefers lifestyle changes, aware of potential weight regain after discontinuation of medications like Trulicity. - Continue current diet and exercise regimen. - Consider adding resistance training to exercise routine. - Reassess weight loss progress in October.

## 2024-03-05 ENCOUNTER — Other Ambulatory Visit: Payer: Self-pay | Admitting: Family Medicine

## 2024-03-05 DIAGNOSIS — E119 Type 2 diabetes mellitus without complications: Secondary | ICD-10-CM

## 2024-03-12 ENCOUNTER — Ambulatory Visit: Admitting: Family Medicine

## 2024-03-18 ENCOUNTER — Other Ambulatory Visit: Payer: Self-pay | Admitting: Family Medicine

## 2024-03-18 DIAGNOSIS — B009 Herpesviral infection, unspecified: Secondary | ICD-10-CM

## 2024-05-16 ENCOUNTER — Other Ambulatory Visit: Payer: Self-pay | Admitting: Medical Genetics

## 2024-05-20 ENCOUNTER — Ambulatory Visit: Admitting: Family Medicine

## 2024-06-04 ENCOUNTER — Encounter: Payer: Self-pay | Admitting: Family Medicine

## 2024-06-04 ENCOUNTER — Ambulatory Visit: Admitting: Family Medicine

## 2024-06-04 ENCOUNTER — Telehealth: Payer: Self-pay

## 2024-06-04 VITALS — BP 136/77 | HR 73 | Temp 98.1°F | Ht 72.0 in | Wt 241.6 lb

## 2024-06-04 DIAGNOSIS — E785 Hyperlipidemia, unspecified: Secondary | ICD-10-CM

## 2024-06-04 DIAGNOSIS — G4733 Obstructive sleep apnea (adult) (pediatric): Secondary | ICD-10-CM

## 2024-06-04 DIAGNOSIS — F419 Anxiety disorder, unspecified: Secondary | ICD-10-CM | POA: Diagnosis not present

## 2024-06-04 DIAGNOSIS — E66811 Obesity, class 1: Secondary | ICD-10-CM | POA: Diagnosis not present

## 2024-06-04 DIAGNOSIS — Z7984 Long term (current) use of oral hypoglycemic drugs: Secondary | ICD-10-CM | POA: Diagnosis not present

## 2024-06-04 DIAGNOSIS — K219 Gastro-esophageal reflux disease without esophagitis: Secondary | ICD-10-CM | POA: Diagnosis not present

## 2024-06-04 DIAGNOSIS — E1169 Type 2 diabetes mellitus with other specified complication: Secondary | ICD-10-CM | POA: Diagnosis not present

## 2024-06-04 DIAGNOSIS — J3089 Other allergic rhinitis: Secondary | ICD-10-CM | POA: Diagnosis not present

## 2024-06-04 DIAGNOSIS — E119 Type 2 diabetes mellitus without complications: Secondary | ICD-10-CM

## 2024-06-04 MED ORDER — LISINOPRIL 5 MG PO TABS: 5.0000 mg | ORAL_TABLET | Freq: Every day | ORAL | 3 refills | Status: AC

## 2024-06-04 NOTE — Patient Instructions (Signed)
 To keep you healthy, please keep in mind the following health maintenance items that you are due for:   Health Maintenance Due  Topic Date Due   Diabetic kidney evaluation - Urine ACR  04/23/2024   HEMOGLOBIN A1C  05/14/2024     Best Wishes,   Dr. Lang

## 2024-06-04 NOTE — Assessment & Plan Note (Signed)
Chronic  Stable with CPAP use  Patient advised to continue CPAP nightly  The patient has had significant benefit from the use of CPAP machine with considerable improvements in quality of life, work production and decrease medical complaints.  The patient will need continued maintenance and care CPAP supplies (including upgrades as deemed appropriate) in order to continue treatment for sleep apnea as this is medically necessary.     

## 2024-06-04 NOTE — Telephone Encounter (Signed)
Will order labs during visit

## 2024-06-04 NOTE — Assessment & Plan Note (Signed)
 Type 2 diabetes mellitus Chronic  Managed with current medication regimen. Blood pressure is 136/77 mmHg. - Ordered updated A1c - Ordered urine albumin creatinine - Continue metformin  1000 mg with lunch and 500 mg at bedtime - Continue Biktarvy 5 mg daily

## 2024-06-04 NOTE — Assessment & Plan Note (Signed)
 Obesity, class 1 Chronic  Class 1 obesity with a BMI of 32. Weight has increased by 2 pounds since last visit but decreased by 4 pounds since June. Engaging in lifestyle modifications including fasting and increased physical activity. - Ordered CMP - Encouraged continued lifestyle modifications including fasting and increased physical activity

## 2024-06-04 NOTE — Assessment & Plan Note (Signed)
 Gastroesophageal reflux disease Chronic  Well-controlled with daily Pepcid 10 mg. No recent issues reported. - Continue Pepcid 10 mg daily

## 2024-06-04 NOTE — Telephone Encounter (Signed)
 Copied from CRM [id number]. Topic: Clinical - Request for Lab/Test Order >> Jun 04, 2024  9:43 AM Nessti S wrote: Reason for CRM: pt would like labs ordered for this afternoon appt. Please call back if possible

## 2024-06-04 NOTE — Progress Notes (Signed)
 "  Established Patient Office Visit  Patient ID: Francisco Griffin, male    DOB: [birth year]  Age: 64 y.o. MRN: [id number] PCP: Sharma Coyer, MD  Chief Complaint  Patient presents with   Medical Management of Chronic Issues    Patient is present for follow up DM, doing well overall     Subjective:     HPI  Discussed the use of AI scribe software for clinical note transcription with the patient, who gave verbal consent to proceed.  History of Present Illness Francisco Griffin is a 64 year old male who presents for a follow-up visit.  He has chronic type two diabetes, managed with metformin  1000 mg with lunch and 500 mg at bedtime, and chromium 100-500 mcg-mg daily.  He manages chronic hyperlipidemia with Zetia  10 mg daily.  He has obstructive sleep apnea and uses a CPAP machine with a humidifier nightly. He experiences nasal congestion due to allergies around 3 AM, leading to dry mouth, and uses saline spray for relief.  He has chronic GERD, effectively managed with Pepcid 10 mg daily, and has not experienced recent issues with acid reflux.  He experiences chronic anxiety, which has been low recently. However, he describes increased anxiety due to personal family stressors, such as his son's recent breakup and house sale. He copes by fasting and walking, which he finds helpful.  He has a history of class one obesity with a BMI of 32. He mentions a slight weight increase over the holidays but notes a decrease since June. 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  He is on lisinopril  5 mg daily for blood pressure management, with a recent reading of 136/77 mmHg. He also takes aspirin 81 mg daily.     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   GERD (gastroesophageal reflux disease) 11/24/2016   Routine adult health maintenance 11/24/2016   Obstructive sleep apnea 11/24/2016   Obesity (BMI 30.0-34.9) 11/24/2016   Osteoarthritis of right knee 08/10/2016   Degenerative tear of medial meniscus of right knee 08/10/2016   Hyperlipidemia due to type 2 diabetes mellitus (HCC) 07/30/2014      ROS    Objective:     BP 136/77 (BP Location: Right Arm, Patient Position: Sitting, Cuff Size: Normal)   Pulse 73   Temp 98.1 F (36.7 C) (Oral)   Ht 6' (1.829 m)   Wt 241 lb 9.6 oz (109.6 kg)   SpO2 98%   BMI 32.77 kg/m   BP Readings from Last 3 Encounters:  06/04/24 136/77  12/11/23 124/73  11/13/23 131/70   Wt Readings from Last 3 Encounters:  06/04/24 241 lb 9.6 oz (109.6 kg)  12/11/23 239 lb (108.4 kg)  11/13/23 245 lb (111.1 kg)      Physical Exam Vitals reviewed.  Constitutional:      General: He is not in acute distress.    Appearance: Normal appearance. He is not ill-appearing.  Cardiovascular:     Rate and Rhythm: Normal rate and regular rhythm.  Pulmonary:     Effort: Pulmonary effort is normal. No respiratory distress.  Breath sounds: No wheezing, rhonchi or rales.  Musculoskeletal:     Right lower leg: No edema.     Left lower leg: No edema.  Neurological:     Mental Status: He is alert and oriented to person, place, and time.  Psychiatric:        Mood and Affect: Mood normal.        Behavior: Behavior normal.     Physical Exam    No results found for any visits on 06/04/24.  Last metabolic panel Lab Results  Component Value Date   GLUCOSE 122 (H) 11/13/2023   NA 141 11/13/2023   K 4.8 11/13/2023   CL 101 11/13/2023   CO2 22 11/13/2023   BUN 12 11/13/2023    CREATININE 1.10 11/13/2023   EGFR 75 11/13/2023   CALCIUM  10.1 11/13/2023   PROT 7.4 11/13/2023   ALBUMIN 4.8 11/13/2023   LABGLOB 2.6 11/13/2023   AGRATIO 2.0 09/05/2021   BILITOT 0.7 11/13/2023   ALKPHOS 49 11/13/2023   AST 21 11/13/2023   ALT 23 11/13/2023   Last lipids Lab Results  Component Value Date   CHOL 146 11/13/2023   HDL 48 11/13/2023   LDLCALC 68 11/13/2023   LDLDIRECT 93 10/27/2020   TRIG 176 (H) 11/13/2023   CHOLHDL 3.0 11/13/2023   The 10-year ASCVD risk score (Arnett DK, et al., 2019) is: 19.5%  Last hemoglobin A1c Lab Results  Component Value Date   HGBA1C 6.8 (H) 11/13/2023   Last thyroid functions Lab Results  Component Value Date   TSH 2.860 04/24/2023   FREET4 0.92 04/24/2023   Last vitamin D No results found for: 25OHVITD2, 25OHVITD3, VD25OH Last vitamin B12 and Folate No results found for: VITAMINB12, FOLATE    The 10-year ASCVD risk score (Arnett DK, et al., 2019) is: 19.5%    Assessment & Plan:   Problem List Items Addressed This Visit     Allergic rhinitis   Allergic rhinitis Chronic allergic rhinitis with nasal congestion, particularly at night. Uses Flonase and Nasonex  with limited relief. Nasalcrom provides some benefit. - Continue Nasalcrom as needed for nasal congestion      Anxiety   Anxiety disorder Chronic anxiety exacerbated by recent family stressors. Engaging in coping strategies such as fasting and increased physical activity. - Encouraged continued coping strategies including fasting and physical activity      Controlled type 2 diabetes mellitus without complication, without long-term current use of insulin (HCC) - Primary (Chronic)   Type 2 diabetes mellitus Chronic  Managed with current medication regimen. Blood pressure is 136/77 mmHg. - Ordered updated A1c - Ordered urine albumin creatinine - Continue metformin  1000 mg with lunch and 500 mg at bedtime - Continue Biktarvy 5 mg daily       Relevant Medications   lisinopril  (ZESTRIL ) 5 MG tablet   Other Relevant Orders   Hemoglobin A1c   Urine Albumin/Creatinine with ratio (send out) [LAB689]   GERD (gastroesophageal reflux disease) (Chronic)   Gastroesophageal reflux disease Chronic  Well-controlled with daily Pepcid 10 mg. No recent issues reported. - Continue Pepcid 10 mg daily      Hyperlipidemia due to type 2 diabetes mellitus (HCC) (Chronic)   Hyperlipidemia Chronic  Managed with Zetia  10 mg daily. Lipid panel to be ordered to assess current status. - Ordered lipid panel - Continue Zetia  10 mg daily      Relevant Medications   lisinopril  (ZESTRIL ) 5 MG tablet   Other Relevant Orders   Lipid panel  Urine Albumin/Creatinine with ratio (send out) [LAB689]   Obesity (BMI 30.0-34.9) (Chronic)   Obesity, class 1 Chronic  Class 1 obesity with a BMI of 32. Weight has increased by 2 pounds since last visit but decreased by 4 pounds since June. Engaging in lifestyle modifications including fasting and increased physical activity. - Ordered CMP - Encouraged continued lifestyle modifications including fasting and increased physical activity      Relevant Orders   CMP14+EGFR   Obstructive sleep apnea (Chronic)   Chronic  Stable with CPAP use  Patient advised to continue CPAP nightly  The patient has had significant benefit from the use of CPAP machine with considerable improvements in quality of life, work production and decrease medical complaints.  The patient will need continued maintenance and care CPAP supplies (including upgrades as deemed appropriate) in order to continue treatment for sleep apnea as this is medically necessary.          Assessment and Plan Assessment & Plan     Return in about 6 months (around 12/02/2024) for CPE.    Rockie Agent, MD [facility]   "

## 2024-06-04 NOTE — Assessment & Plan Note (Signed)
 Anxiety disorder Chronic anxiety exacerbated by recent family stressors. Engaging in coping strategies such as fasting and increased physical activity. - Encouraged continued coping strategies including fasting and physical activity

## 2024-06-04 NOTE — Assessment & Plan Note (Signed)
 Hyperlipidemia Chronic  Managed with Zetia  10 mg daily. Lipid panel to be ordered to assess current status. - Ordered lipid panel - Continue Zetia  10 mg daily

## 2024-06-04 NOTE — Assessment & Plan Note (Signed)
 Allergic rhinitis Chronic allergic rhinitis with nasal congestion, particularly at night. Uses Flonase and Nasonex  with limited relief. Nasalcrom provides some benefit. - Continue Nasalcrom as needed for nasal congestion

## 2024-06-05 LAB — MICROALBUMIN / CREATININE URINE RATIO
Creatinine, Urine: 62.2 mg/dL
Microalb/Creat Ratio: <5 mg/g{creat} (ref 0–29)
Microalbumin, Urine: <3 ug/mL

## 2024-06-05 LAB — LIPID PANEL

## 2024-06-06 ENCOUNTER — Ambulatory Visit: Payer: Self-pay | Admitting: Family Medicine

## 2024-06-06 DIAGNOSIS — E66811 Obesity, class 1: Secondary | ICD-10-CM

## 2024-06-06 DIAGNOSIS — E119 Type 2 diabetes mellitus without complications: Secondary | ICD-10-CM

## 2024-06-06 DIAGNOSIS — G4733 Obstructive sleep apnea (adult) (pediatric): Secondary | ICD-10-CM

## 2024-06-06 MED ORDER — TIRZEPATIDE 2.5 MG/0.5ML ~~LOC~~ SOAJ: 2.5000 mg | SUBCUTANEOUS | 2 refills | Status: AC

## 2024-06-06 NOTE — Telephone Encounter (Signed)
 He will take both the mounjaro  and metformin .

## 2024-06-06 NOTE — Telephone Encounter (Signed)
 Copied from CRM #[id number]. Topic: Clinical - Medication Question >> Jun 06, 2024  1:27 PM Gattis SQUIBB wrote: Reason for CRM: Pt called saying he is switching medication to Mounjaro  and he needs to know how to change from Metformin  to Mounjaro .  [phone]

## 2024-06-07 LAB — CMP14+EGFR
ALT: 21 IU/L (ref 0–44)
AST: 21 IU/L (ref 0–40)
Albumin: 4.7 g/dL (ref 3.9–4.9)
Alkaline Phosphatase: 48 IU/L (ref 47–123)
BUN/Creatinine Ratio: 14 (ref 10–24)
BUN: 15 mg/dL (ref 8–27)
Bilirubin Total: 0.6 mg/dL (ref 0.0–1.2)
CO2: 22 mmol/L (ref 20–29)
Calcium: 9.8 mg/dL (ref 8.6–10.2)
Chloride: 98 mmol/L (ref 96–106)
Creatinine, Ser: 1.04 mg/dL (ref 0.76–1.27)
Globulin, Total: 2.5 g/dL (ref 1.5–4.5)
Glucose: 111 mg/dL — AB (ref 70–99)
Potassium: 4.4 mmol/L (ref 3.5–5.2)
Sodium: 137 mmol/L (ref 134–144)
Total Protein: 7.2 g/dL (ref 6.0–8.5)
eGFR: 80 mL/min/1.73

## 2024-06-07 LAB — LIPID PANEL
Cholesterol, Total: 150 mg/dL (ref 100–199)
HDL: 52 mg/dL
LDL CALC COMMENT:: 2.9 ratio (ref 0.0–5.0)
LDL Chol Calc (NIH): 75 mg/dL (ref 0–99)
Triglycerides: 128 mg/dL (ref 0–149)
VLDL Cholesterol Cal: 23 mg/dL (ref 5–40)

## 2024-06-07 LAB — HEMOGLOBIN A1C
Est. average glucose Bld gHb Est-mCnc: 154 mg/dL
Hgb A1c MFr Bld: 7 % — ABNORMAL HIGH (ref 4.8–5.6)

## 2024-07-03 ENCOUNTER — Other Ambulatory Visit

## 2024-07-03 DIAGNOSIS — Z006 Encounter for examination for normal comparison and control in clinical research program: Secondary | ICD-10-CM

## 2024-08-04 ENCOUNTER — Ambulatory Visit: Admitting: Family Medicine

## 2024-12-05 ENCOUNTER — Encounter: Admitting: Family Medicine
